# Patient Record
Sex: Male | Born: 1970
Health system: Southern US, Community
[De-identification: ages and names within clinical notes are randomized; demographics above are authoritative.]

## PROBLEM LIST (undated history)

## (undated) DIAGNOSIS — E7800 Pure hypercholesterolemia, unspecified: Secondary | ICD-10-CM

## (undated) HISTORY — PX: HERNIA REPAIR: SHX51

## (undated) HISTORY — PX: SKIN SURGERY: SHX2413

---

## 2000-04-01 ENCOUNTER — Ambulatory Visit (HOSPITAL_COMMUNITY): Admission: RE | Admit: 2000-04-01 | Discharge: 2000-04-01 | Payer: Self-pay | Admitting: Surgery

## 2006-04-29 ENCOUNTER — Encounter (INDEPENDENT_AMBULATORY_CARE_PROVIDER_SITE_OTHER): Payer: Self-pay | Admitting: Specialist

## 2006-04-29 ENCOUNTER — Inpatient Hospital Stay (HOSPITAL_COMMUNITY): Admission: AD | Admit: 2006-04-29 | Discharge: 2006-05-01 | Payer: Self-pay | Admitting: Internal Medicine

## 2006-05-07 ENCOUNTER — Ambulatory Visit (HOSPITAL_COMMUNITY): Admission: RE | Admit: 2006-05-07 | Discharge: 2006-05-07 | Payer: Self-pay | Admitting: Family Medicine

## 2006-12-18 ENCOUNTER — Emergency Department (HOSPITAL_COMMUNITY): Admission: EM | Admit: 2006-12-18 | Discharge: 2006-12-18 | Payer: Self-pay | Admitting: Emergency Medicine

## 2008-02-04 ENCOUNTER — Emergency Department (HOSPITAL_COMMUNITY): Admission: EM | Admit: 2008-02-04 | Discharge: 2008-02-04 | Payer: Self-pay | Admitting: Emergency Medicine

## 2009-09-05 ENCOUNTER — Ambulatory Visit (HOSPITAL_COMMUNITY): Admission: RE | Admit: 2009-09-05 | Discharge: 2009-09-05 | Payer: Self-pay | Admitting: Family Medicine

## 2009-09-05 IMAGING — CR DG LUMBAR SPINE COMPLETE 4+V
5 series · 5 of 5 positions shown · non-contrast
Comparison: None

CLINICAL DATA: Low back pain and

LUMBAR SPINE - COMPLETE 4+ VIEW

[view not recorded (1 of 5)]
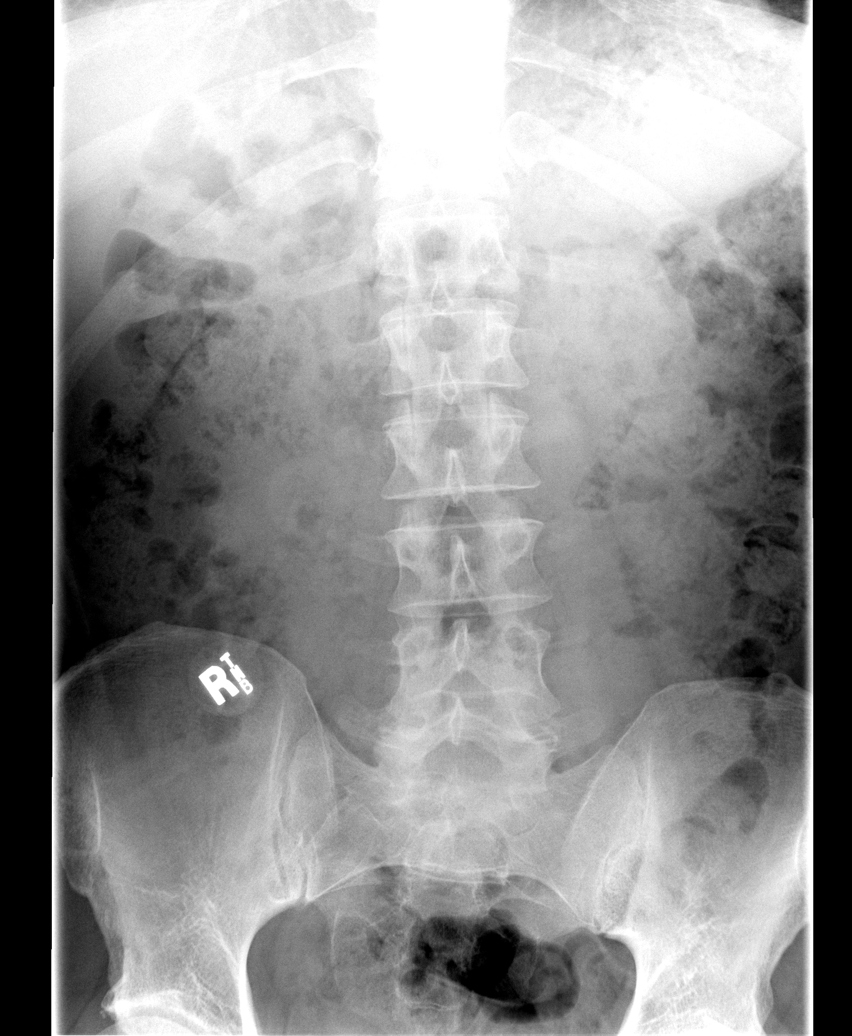

[view not recorded (2 of 5)]
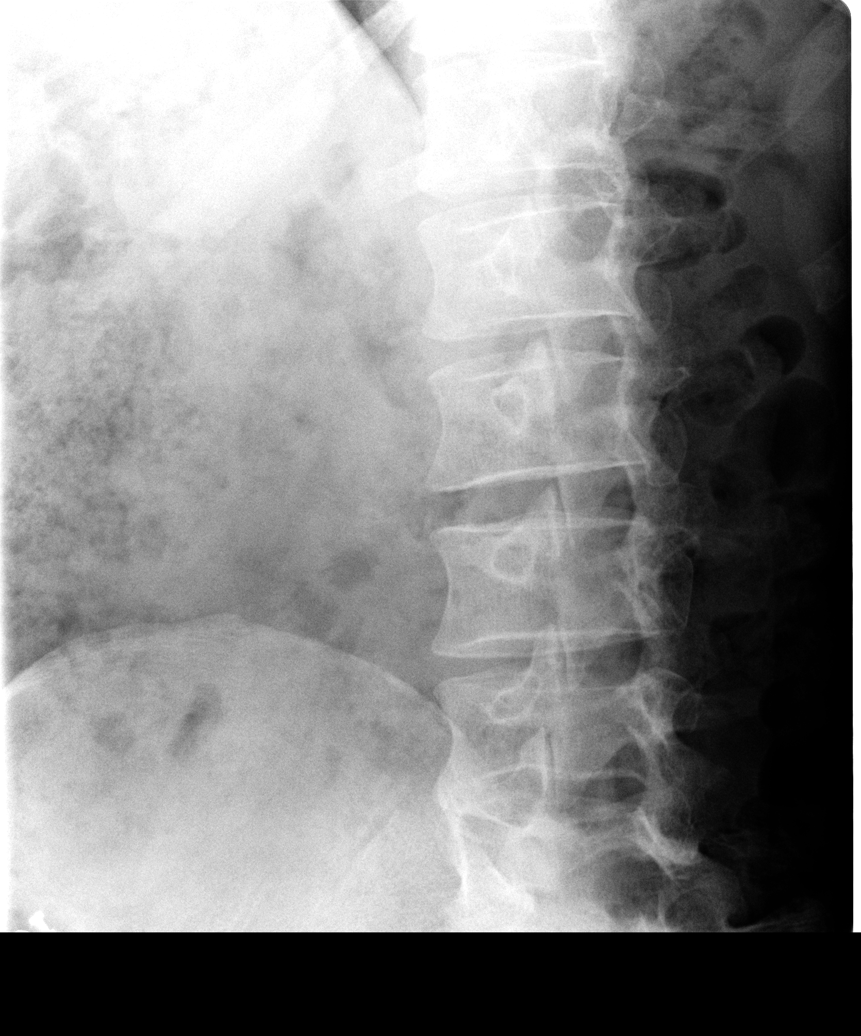

[view not recorded (3 of 5)]
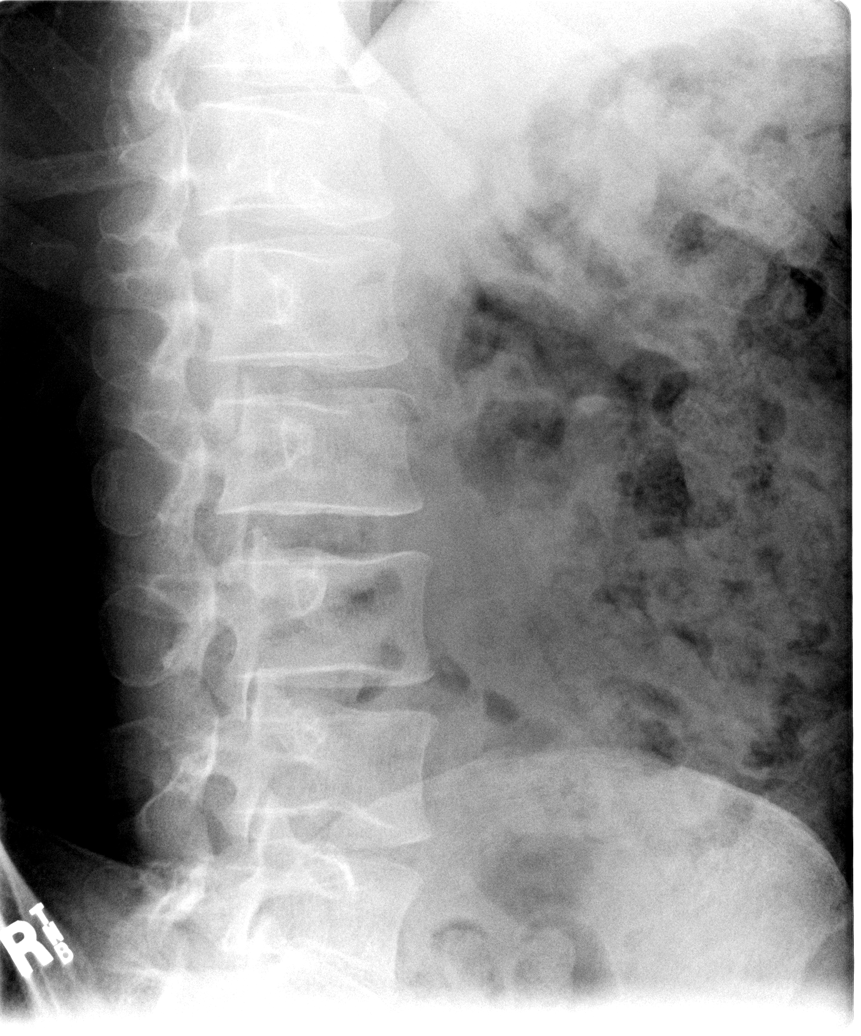

[view not recorded (4 of 5)]
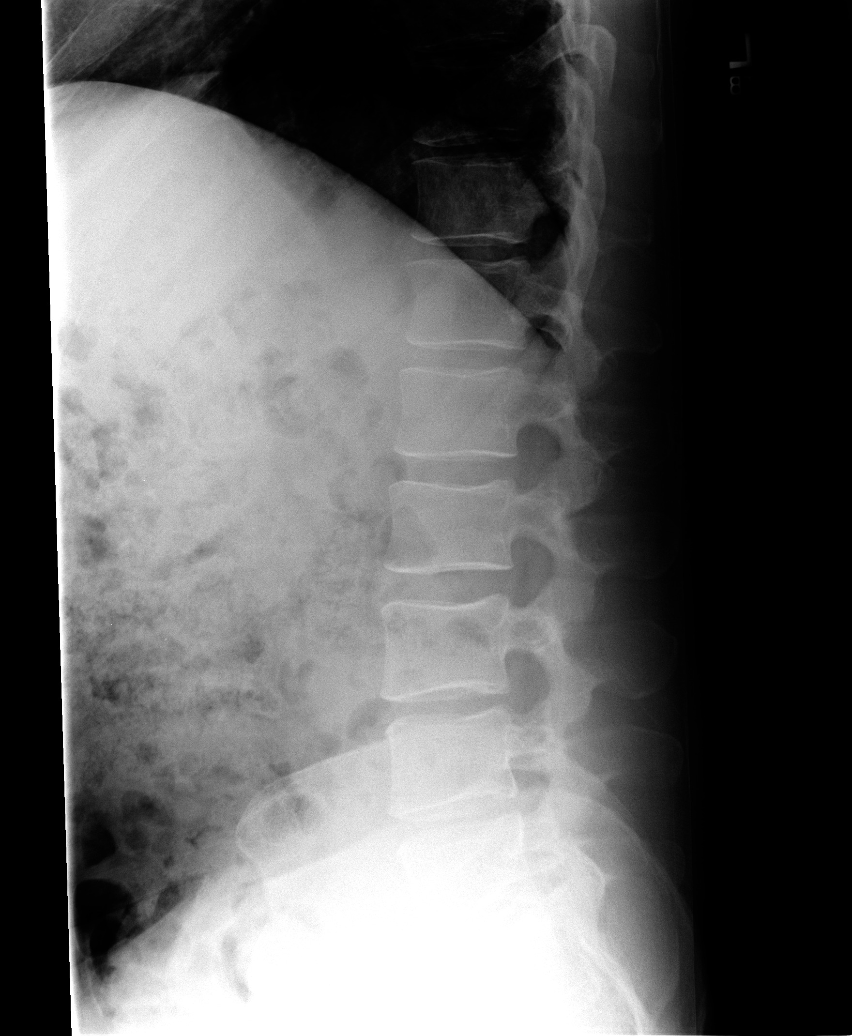

[view not recorded (5 of 5)]
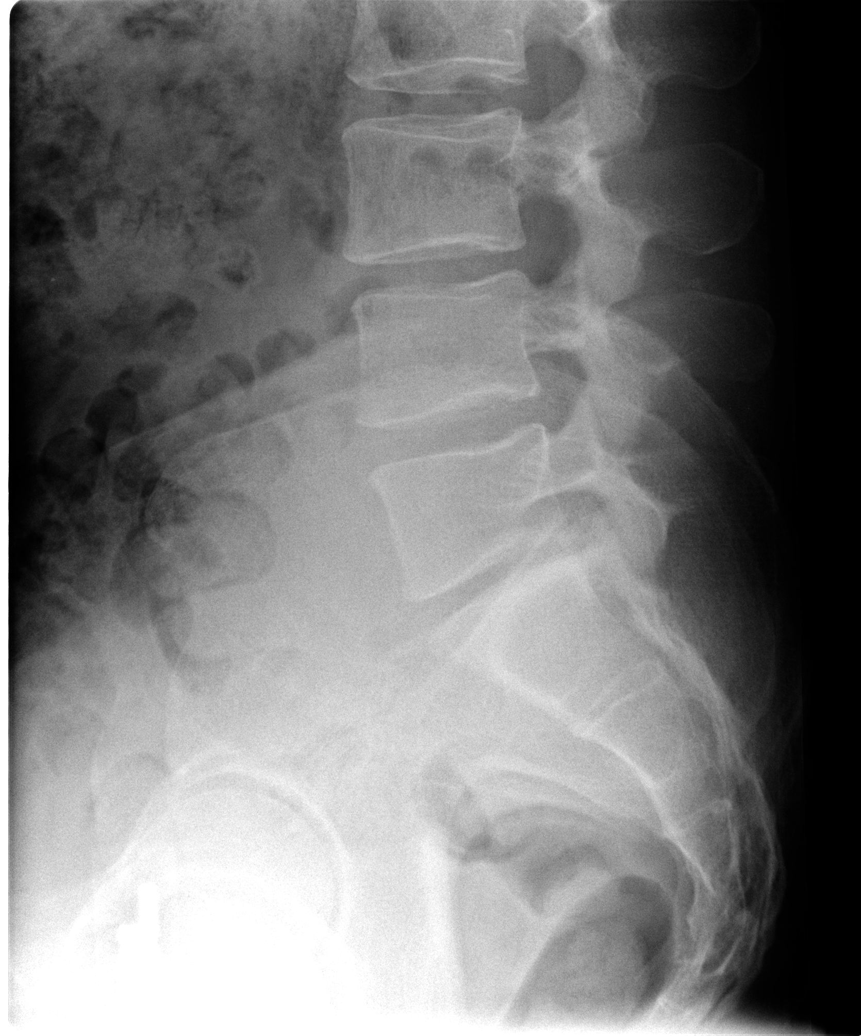

[5 of 5 positions shown; findings below may reference images not displayed]

FINDINGS: Five lumbar-type vertebra.  Normal alignment of the
vertebral bodies.  No evidence of subluxation.  No loss of
vertebral body height.  Oblique projections demonstrate no pars
defect.
IMPRESSION: No acute bony abnormality.

## 2011-03-06 NOTE — H&P (Signed)
NAME:  Benjamin Estrada, Benjamin Estrada                ACCOUNT NO.:  [id number]   MEDICAL RECORD NO.:  [id number]          PATIENT TYPE:  INP   LOCATION:  A313                          FACILITY:  [facility]   PHYSICIAN:  Madelin Rear. Sherwood Gambler, MD  DATE OF BIRTH:  December 07, 1970   DATE OF ADMISSION:  04/29/2006  DATE OF DISCHARGE:  LH                                HISTORY & PHYSICAL   CHIEF COMPLAINT:  Left arm pain.   HISTORY OF PRESENT ILLNESS:  The patient presented to the office and saw my  partner two days prior to this visit with a bump on his arm that was  draining.  It was painful and red.  It was cultured appropriately, with C&S  pending at present.  He was placed on presumed MRSA staph carbuncle  treatment with Bactrim and topical Bactroban.  His symptoms progressed, with  increased swelling, redness, and pain.   PAST MEDICAL HISTORY:  Status post herniorrhaphy; otherwise, unremarkable.   MEDICATIONS:  He is maintained on no chronic medications.   ALLERGIES:  HE IS ALLERGIC TO NO MEDICATIONS.   SOCIAL HISTORY:  Positive chewing tobacco.  Negative alcohol.   FAMILY HISTORY:  Positive for liver carcinoma in his father.  Mother is in  good health.  A lot of paternal and maternal grandparent carcinomas.   REVIEW OF SYSTEMS:  As above.  He denies any true rigors or other symptoms,  except for pain and swelling.   PHYSICAL EXAMINATION:  EXTREMITIES:  Marked cellulitic involvement of his  left forearm.  He has no lymphangitis or lymphadenitis.  He does have a semi-  fluctuant area in the center that is draining some purulent material.  It is  about 2 x 2 cm or 2.5 x 2.5 cm in overall diameter.  This is no crepitance.  He has marked forearm swelling, but his distal neurovascular exam is intact.   IMPRESSION:  Probable methicillin-resistant Staphylococcus aureus staph  sepsis, failure on oral antibiotics.   PLAN:  We are going to admit him for IV antibiotic therapy and surgical  consultation for possible  I&D.      Madelin Rear. Sherwood Gambler, MD  Electronically Signed    LJF/MEDQ  D:  04/29/2006  T:  04/29/2006  Job:  [id number]

## 2011-03-06 NOTE — Consult Note (Signed)
NAME:  Benjamin Estrada, Benjamin Estrada NO.:  [id number]   MEDICAL RECORD NO.:  [id number]          PATIENT TYPE:  INP   LOCATION:  A313                          FACILITY:  [facility]   PHYSICIAN:  Barbaraann Barthel, M.D. DATE OF BIRTH:  [birth year]   DATE OF CONSULTATION:  04/28/2006  DATE OF DISCHARGE:                                   CONSULTATION   NOTE:  Surgery was asked to see this 40 year old white male with a 5-day  history of discomfort in his left arm. He noticed a pimple on this area that  is a later enlarged, followed by cellulitis and swelling and then developed  fluctuans and was obviously getting worse.  He was admitted to the medical  service.  Antibiotics were initiated and surgery was consulted.  In essence,  I feel that he may have an abscess in this area and we will do an I&D and  obtain intraoperative cultures.  This can be done under local anesthesia.  This was explained to him.  The possibility also that this might be a spider  bite.  I am really not sure but clinically there seems to be some fluctuans  and we need to I&D this.  He is a non diabetic and his past medical history  is really unremarkable.  At present he is being  admitted with the treatment  with vancomycin and Cleocin.  He had been treated earlier with p.o. Bactrim  and topical Bactroban without  improvement.  We will obtain cultures as  stated to help delineate this.  At present will continue on this as this may  likely be an MRSA-staph type of folliculitis.      Barbaraann Barthel, M.D.  Electronically Signed     WB/MEDQ  D:  04/29/2006  T:  04/29/2006  Job:  [id number]   cc:   Madelin Rear. Sherwood Gambler, MD  Fax: [phone]

## 2011-03-06 NOTE — Op Note (Signed)
NAME:  EVERETTE, MALL NO.:  [id number]   MEDICAL RECORD NO.:  [id number]          PATIENT TYPE:  INP   LOCATION:  A313                          FACILITY:  [facility]   PHYSICIAN:  Barbaraann Barthel, M.D. DATE OF BIRTH:  08/12/1971   DATE OF PROCEDURE:  04/29/2006  DATE OF DISCHARGE:                                 OPERATIVE REPORT   SURGEON:  Barbaraann Barthel, M.D.   PREOPERATIVE DIAGNOSIS:  Abscess left forearm.   POSTOPERATIVE DIAGNOSIS:  Abscess left forearm.   PROCEDURE:  Incision and drainage of left forearm with debridement.   NOTE:  This is a 40 year old white male who had a 4-5 day history of  swelling in his right arm accompanied with erythema and increasing  discomfort.  He was admitted to the medical service.  There was a question  in my mind clinically whether this was an abscess or a spider bite, as the  patient gave a history of doing some mulching and developed some sort of  discomfort in his arm after that. However, clinically, he had what I felt to  be fluctuance and I took him to the operating room in the minor room with  the suspicion of an abscess present.   OPERATIVE FINDINGS:  Pus, yellowish type of fluid.  This was sent for  cultures.  Some necrotic tissue which was debrided   SPECIMENS:  Debrided tissue with cultures.   TECHNIQUE:  The patient was placed in the supine position, after prepping  with Betadine solution, draped in the usual manner.  I used 1% Xylocaine  without epinephrine, approximately 12 mL were used to anesthetize this area.  An elliptical incision was carried out over this wound removing the most  necrotic centrally located portion and an obvious abscess issued forth.  This was all evacuated and the necrotic tissue was further debrided.  Bleeding was controlled with the cautery device and after irrigating with  normal saline solution, the wound was packed open with normal saline soaked  gauze.  Prior to closure, all  sponge, needle and instrument counts were  found to be correct.  Estimated blood loss was minimal.  The patient  tolerated the procedure well and was taken directly to the floor.  There  were no complications.   The patient will continue his antibiotics as ordered by Dr. Sherwood Gambler.  There  will be change of service.  I will follow as needed and make follow-up  arrangements for his wound care when he is discharged.      Barbaraann Barthel, M.D.  Electronically Signed     WB/MEDQ  D:  04/29/2006  T:  04/29/2006  Job:  [id number]   cc:   Madelin Rear. Sherwood Gambler, MD  Fax: [phone]

## 2011-03-06 NOTE — Op Note (Signed)
[place]  Patient:    Benjamin Estrada, Benjamin Estrada                       MRN: [id number] Proc. Date: 04/01/00 Adm. Date:  [id number] Disc. Date: [id number] Attending:  Abigail Miyamoto A                           Operative Report  PREOPERATIVE DIAGNOSIS:  Right inguinal hernia.  POSTOPERATIVE DIAGNOSIS:  Right inguinal hernia.  OPERATION:  Right inguinal hernia repair.  SURGEON:  Abigail Miyamoto, M.D.  ANESTHESIA:  General endotracheal anesthesia and 0.25% Marcaine plain.  ESTIMATED BLOOD LOSS:  Minimal.  PROCEDURE IN DETAIL:  The patient was brought to the operating room and identified as Benjamin Estrada.  He was placed supine on the operating table, and general anesthesia was induced.  His right groin was then prepped and draped in the usual sterile fashion.  Using a #10 blade, a small longitudinal incision was made in the patients right groin.  The incision was carried down through the Scarpa fascia with electrocautery.  The external oblique fascia was then identified and opened with the scalpel and furthered toward the external ring with Metzenbaum scissors.  The testicular cord and structures were easily identified and controlled circumferentially with a Penrose drain.  The large chronic indirect hernia sac was then identified and separated from the cord structures.  The sac was then opened, an a finger was inserted into the perineal cavity.  The sac was then dissected down to its base.  The base was then closed off with a 2-0 silk pursestring suture.  The redundant sac was then cut with the electrocautery.  Next, the preperitoneal space around the internal ring was dissected out with a Raytec.  Next, a piece of the Prolene hernia system mesh from [employer] was brought onto the field. The mesh was medium and size.  The mesh was then inserted into the preperitoneal space.  The underlying piece of mesh folded into the peritoneal space nicely.  The  external mesh then deployed well into the inguinal floor. The mesh was then slit on the lateral edge for incorporating the cord structures.  Next, the mesh was sewn in place with interrupted 2-0 silk suture, first to the pubic tubercle, then to the transversalis fascia and the shoving edge of the inguinal ligament, then laterally.  The mesh was then also closed around the internal ring with a suture as well.  The wound was then thoroughly irrigated with normal saline.  Then 2-0 Vicryl was used to close the external oblique fascia in running fashion.  Scarpas fascia was closed with interrupted 3-0 Vicryl sutures, and skin was closed with running 4-0 Monocryl.  Steri-Strips, gauze, and tape were then applied.  Prior to closing, the wound was anesthetized with 0.25% Marcaine.  The patient tolerated the procedure well.  All sponge, needle, and instrument counts were correct at the end of the procedure.  The patient was then extubated in the operating room and taken in stable condition to the recovery room. DD:  04/01/00 TD:  04/05/00 Job: [id number]

## 2011-03-06 NOTE — Discharge Summary (Signed)
NAME:  MAXDEN, NAJI                ACCOUNT NO.:  [id number]   MEDICAL RECORD NO.:  [id number]          PATIENT TYPE:  INP   LOCATION:  A313                          FACILITY:  [facility]   PHYSICIAN:  Madelin Rear. Sherwood Gambler, MD  DATE OF BIRTH:  July 30, 1971   DATE OF ADMISSION:  04/29/2006  DATE OF DISCHARGE:  07/14/2007LH                                 DISCHARGE SUMMARY   DISCHARGE MEDICATIONS:  Cleocin 300 mg p.o. four times a day.   DISCHARGE DIAGNOSES:  Abscess and cellulitis, left forearm, secondary to  Staphylococcus aureus, presumed methicillin-resistant Staphylococcus aureus.   SUMMARY:  The patient was admitted with left-arm pain and mass with  fluctuance in his left forearm with extending cellulitis.  He had failed to  respond to oral antibiotics.  He was admitted and placed on presumptive  treatment for MRSA.  Seen in consultation with subsequent debridement,  drainage and cultures obtain with Dr. Malvin Johns.  On the day of discharge, he  was stable.  He will follow up in our office.      Madelin Rear. Sherwood Gambler, MD  Electronically Signed     LJF/MEDQ  D:  05/27/2006  T:  05/27/2006  Job:  [id number]

## 2016-02-21 DIAGNOSIS — F17220 Nicotine dependence, chewing tobacco, uncomplicated: Secondary | ICD-10-CM | POA: Diagnosis not present

## 2016-02-21 DIAGNOSIS — N529 Male erectile dysfunction, unspecified: Secondary | ICD-10-CM | POA: Diagnosis not present

## 2016-02-21 DIAGNOSIS — Z716 Tobacco abuse counseling: Secondary | ICD-10-CM | POA: Diagnosis not present

## 2016-02-21 DIAGNOSIS — E782 Mixed hyperlipidemia: Secondary | ICD-10-CM | POA: Diagnosis not present

## 2016-09-08 DIAGNOSIS — E782 Mixed hyperlipidemia: Secondary | ICD-10-CM | POA: Diagnosis not present

## 2016-09-09 DIAGNOSIS — N529 Male erectile dysfunction, unspecified: Secondary | ICD-10-CM | POA: Diagnosis not present

## 2016-09-09 DIAGNOSIS — Z720 Tobacco use: Secondary | ICD-10-CM | POA: Diagnosis not present

## 2016-09-09 DIAGNOSIS — Z6828 Body mass index (BMI) 28.0-28.9, adult: Secondary | ICD-10-CM | POA: Diagnosis not present

## 2016-09-09 DIAGNOSIS — E782 Mixed hyperlipidemia: Secondary | ICD-10-CM | POA: Diagnosis not present

## 2017-02-22 DIAGNOSIS — E782 Mixed hyperlipidemia: Secondary | ICD-10-CM | POA: Diagnosis not present

## 2017-02-24 DIAGNOSIS — Z720 Tobacco use: Secondary | ICD-10-CM | POA: Diagnosis not present

## 2017-02-24 DIAGNOSIS — N529 Male erectile dysfunction, unspecified: Secondary | ICD-10-CM | POA: Diagnosis not present

## 2017-02-24 DIAGNOSIS — E782 Mixed hyperlipidemia: Secondary | ICD-10-CM | POA: Diagnosis not present

## 2017-02-24 DIAGNOSIS — Z0000 Encounter for general adult medical examination without abnormal findings: Secondary | ICD-10-CM | POA: Diagnosis not present

## 2017-03-17 DIAGNOSIS — M545 Low back pain: Secondary | ICD-10-CM | POA: Diagnosis not present

## 2017-03-17 DIAGNOSIS — Z6828 Body mass index (BMI) 28.0-28.9, adult: Secondary | ICD-10-CM | POA: Diagnosis not present

## 2017-04-14 DIAGNOSIS — Z6836 Body mass index (BMI) 36.0-36.9, adult: Secondary | ICD-10-CM | POA: Diagnosis not present

## 2017-04-14 DIAGNOSIS — G8929 Other chronic pain: Secondary | ICD-10-CM | POA: Diagnosis not present

## 2017-04-14 DIAGNOSIS — M545 Low back pain: Secondary | ICD-10-CM | POA: Diagnosis not present

## 2018-02-28 DIAGNOSIS — Z0000 Encounter for general adult medical examination without abnormal findings: Secondary | ICD-10-CM | POA: Diagnosis not present

## 2018-02-28 DIAGNOSIS — N529 Male erectile dysfunction, unspecified: Secondary | ICD-10-CM | POA: Diagnosis not present

## 2018-02-28 DIAGNOSIS — Z720 Tobacco use: Secondary | ICD-10-CM | POA: Diagnosis not present

## 2018-02-28 DIAGNOSIS — E782 Mixed hyperlipidemia: Secondary | ICD-10-CM | POA: Diagnosis not present

## 2018-03-31 DIAGNOSIS — M62830 Muscle spasm of back: Secondary | ICD-10-CM | POA: Diagnosis not present

## 2018-03-31 DIAGNOSIS — M545 Low back pain: Secondary | ICD-10-CM | POA: Diagnosis not present

## 2018-07-04 DIAGNOSIS — E782 Mixed hyperlipidemia: Secondary | ICD-10-CM | POA: Diagnosis not present

## 2018-07-06 DIAGNOSIS — Z6828 Body mass index (BMI) 28.0-28.9, adult: Secondary | ICD-10-CM | POA: Diagnosis not present

## 2018-07-06 DIAGNOSIS — F5221 Male erectile disorder: Secondary | ICD-10-CM | POA: Diagnosis not present

## 2018-07-06 DIAGNOSIS — E782 Mixed hyperlipidemia: Secondary | ICD-10-CM | POA: Diagnosis not present

## 2018-07-06 DIAGNOSIS — Z720 Tobacco use: Secondary | ICD-10-CM | POA: Diagnosis not present

## 2021-09-16 ENCOUNTER — Emergency Department (HOSPITAL_COMMUNITY): Payer: BC Managed Care – PPO

## 2021-09-16 ENCOUNTER — Encounter (HOSPITAL_COMMUNITY): Payer: Self-pay | Admitting: *Deleted

## 2021-09-16 ENCOUNTER — Inpatient Hospital Stay (HOSPITAL_COMMUNITY)
Admission: EM | Disposition: A | Discharge status: Home / Self Care | Payer: Self-pay | Source: Home / Self Care | Attending: Cardiology

## 2021-09-16 ENCOUNTER — Inpatient Hospital Stay (HOSPITAL_COMMUNITY)
Admission: EM | Admit: 2021-09-16 | Discharge: 2021-09-17 | DRG: 282 | Disposition: A | Discharge status: Home / Self Care | Payer: BC Managed Care – PPO | Attending: Interventional Cardiology | Admitting: Interventional Cardiology

## 2021-09-16 ENCOUNTER — Other Ambulatory Visit: Payer: Self-pay

## 2021-09-16 DIAGNOSIS — R0602 Shortness of breath: Secondary | ICD-10-CM | POA: Diagnosis not present

## 2021-09-16 DIAGNOSIS — I2510 Atherosclerotic heart disease of native coronary artery without angina pectoris: Secondary | ICD-10-CM | POA: Diagnosis not present

## 2021-09-16 DIAGNOSIS — Z716 Tobacco abuse counseling: Secondary | ICD-10-CM

## 2021-09-16 DIAGNOSIS — I10 Essential (primary) hypertension: Secondary | ICD-10-CM

## 2021-09-16 DIAGNOSIS — I214 Non-ST elevation (NSTEMI) myocardial infarction: Secondary | ICD-10-CM | POA: Diagnosis not present

## 2021-09-16 DIAGNOSIS — R001 Bradycardia, unspecified: Secondary | ICD-10-CM | POA: Diagnosis not present

## 2021-09-16 DIAGNOSIS — Z720 Tobacco use: Secondary | ICD-10-CM

## 2021-09-16 DIAGNOSIS — Z20822 Contact with and (suspected) exposure to covid-19: Secondary | ICD-10-CM | POA: Diagnosis not present

## 2021-09-16 DIAGNOSIS — F17220 Nicotine dependence, chewing tobacco, uncomplicated: Secondary | ICD-10-CM | POA: Diagnosis present

## 2021-09-16 DIAGNOSIS — E785 Hyperlipidemia, unspecified: Secondary | ICD-10-CM | POA: Diagnosis not present

## 2021-09-16 DIAGNOSIS — I25110 Atherosclerotic heart disease of native coronary artery with unstable angina pectoris: Secondary | ICD-10-CM | POA: Diagnosis present

## 2021-09-16 DIAGNOSIS — R079 Chest pain, unspecified: Secondary | ICD-10-CM | POA: Diagnosis not present

## 2021-09-16 DIAGNOSIS — E782 Mixed hyperlipidemia: Secondary | ICD-10-CM | POA: Diagnosis not present

## 2021-09-16 HISTORY — DX: Pure hypercholesterolemia, unspecified: E78.00

## 2021-09-16 HISTORY — PX: LEFT HEART CATH AND CORONARY ANGIOGRAPHY: CATH118249

## 2021-09-16 LAB — CBC WITH DIFFERENTIAL/PLATELET
Abs Immature Granulocytes: 0.02 10*3/uL (ref 0.00–0.07)
Basophils Absolute: 0 10*3/uL (ref 0.0–0.1)
Basophils Relative: 1 %
Eosinophils Absolute: 0.1 10*3/uL (ref 0.0–0.5)
Eosinophils Relative: 1 %
HCT: 44.2 % (ref 39.0–52.0)
Hemoglobin: 15 g/dL (ref 13.0–17.0)
Immature Granulocytes: 0 %
Lymphocytes Relative: 32 %
Lymphs Abs: 1.9 10*3/uL (ref 0.7–4.0)
MCH: 30.1 pg (ref 26.0–34.0)
MCHC: 33.9 g/dL (ref 30.0–36.0)
MCV: 88.8 fL (ref 80.0–100.0)
Monocytes Absolute: 0.5 10*3/uL (ref 0.1–1.0)
Monocytes Relative: 8 %
Neutro Abs: 3.4 10*3/uL (ref 1.7–7.7)
Neutrophils Relative %: 58 %
Platelets: 210 10*3/uL (ref 150–400)
RBC: 4.98 MIL/uL (ref 4.22–5.81)
RDW: 12.5 % (ref 11.5–15.5)
WBC: 5.8 10*3/uL (ref 4.0–10.5)
nRBC: 0 % (ref 0.0–0.2)

## 2021-09-16 LAB — CREATININE, SERUM
Creatinine, Ser: 0.84 mg/dL (ref 0.61–1.24)
GFR, Estimated: >60 mL/min (ref >60)

## 2021-09-16 LAB — COMPREHENSIVE METABOLIC PANEL
ALT: 31 U/L (ref 0–44)
AST: 27 U/L (ref 15–41)
Albumin: 4.3 g/dL (ref 3.5–5.0)
Alkaline Phosphatase: 39 U/L (ref 38–126)
Anion gap: 7 (ref 5–15)
BUN: 11 mg/dL (ref 6–20)
CO2: 26 mmol/L (ref 22–32)
Calcium: 9.2 mg/dL (ref 8.9–10.3)
Chloride: 104 mmol/L (ref 98–111)
Creatinine, Ser: 0.88 mg/dL (ref 0.61–1.24)
GFR, Estimated: >60 mL/min (ref >60)
Glucose, Bld: 99 mg/dL (ref 70–99)
Potassium: 3.9 mmol/L (ref 3.5–5.1)
Sodium: 137 mmol/L (ref 135–145)
Total Bilirubin: 0.8 mg/dL (ref 0.3–1.2)
Total Protein: 6.8 g/dL (ref 6.5–8.1)

## 2021-09-16 LAB — TROPONIN I (HIGH SENSITIVITY): Troponin I (High Sensitivity): 1050 ng/L (ref <18)

## 2021-09-16 LAB — HIV ANTIBODY (ROUTINE TESTING W REFLEX): HIV Screen 4th Generation wRfx: NONREACTIVE

## 2021-09-16 LAB — RESP PANEL BY RT-PCR (FLU A&B, COVID) ARPGX2
Influenza A by PCR: NEGATIVE
Influenza B by PCR: NEGATIVE
SARS Coronavirus 2 by RT PCR: NEGATIVE

## 2021-09-16 SURGERY — LEFT HEART CATH AND CORONARY ANGIOGRAPHY
Anesthesia: LOCAL

## 2021-09-16 MED ORDER — ONDANSETRON HCL 4 MG/2ML IJ SOLN: 4.0000 mg | Freq: Four times a day (QID) | INTRAMUSCULAR | Status: DC | PRN

## 2021-09-16 MED ORDER — HEPARIN (PORCINE) 25000 UT/250ML-% IV SOLN
1200.0000 [IU]/h | INTRAVENOUS | Status: DC
  Administered 2021-09-16: 1200 [IU]/h via INTRAVENOUS

## 2021-09-16 MED ORDER — ACETAMINOPHEN 325 MG PO TABS: 650.0000 mg | ORAL_TABLET | ORAL | Status: DC | PRN

## 2021-09-16 MED ORDER — SODIUM CHLORIDE 0.9% FLUSH: 3.0000 mL | INTRAVENOUS | Status: DC | PRN

## 2021-09-16 MED ORDER — NITROGLYCERIN 0.4 MG SL SUBL: 0.4000 mg | SUBLINGUAL_TABLET | SUBLINGUAL | Status: DC | PRN

## 2021-09-16 MED ORDER — FENTANYL CITRATE (PF) 100 MCG/2ML IJ SOLN
INTRAMUSCULAR | Status: DC | PRN
  Administered 2021-09-16: 50 ug via INTRAVENOUS

## 2021-09-16 MED ORDER — SODIUM CHLORIDE 0.9 % IV SOLN: 250.0000 mL | INTRAVENOUS | Status: DC | PRN

## 2021-09-16 MED ORDER — IOHEXOL 350 MG/ML SOLN
INTRAVENOUS | Status: DC | PRN
  Administered 2021-09-16: 55 mL

## 2021-09-16 MED ORDER — HEPARIN (PORCINE) IN NACL 1000-0.9 UT/500ML-% IV SOLN
INTRAVENOUS | Status: DC | PRN
  Administered 2021-09-16 (×3): 500 mL

## 2021-09-16 MED ORDER — SODIUM CHLORIDE 0.9 % WEIGHT BASED INFUSION: 1.0000 mL/kg/h | INTRAVENOUS | Status: DC

## 2021-09-16 MED ORDER — ASPIRIN 325 MG PO TABS
325.0000 mg | ORAL_TABLET | Freq: Once | ORAL | Status: AC
  Administered 2021-09-16: 325 mg via ORAL

## 2021-09-16 MED ORDER — HEPARIN SODIUM (PORCINE) 5000 UNIT/ML IJ SOLN
5000.0000 [IU] | Freq: Three times a day (TID) | INTRAMUSCULAR | Status: DC
  Administered 2021-09-17: 5000 [IU] via SUBCUTANEOUS

## 2021-09-16 MED ORDER — VERAPAMIL HCL 2.5 MG/ML IV SOLN
INTRAVENOUS | Status: DC | PRN
  Administered 2021-09-16: 10 mL via INTRA_ARTERIAL

## 2021-09-16 MED ORDER — ASPIRIN EC 81 MG PO TBEC
81.0000 mg | DELAYED_RELEASE_TABLET | Freq: Every day | ORAL | Status: DC
  Administered 2021-09-17: 81 mg via ORAL
  Filled 2021-09-16: qty 1

## 2021-09-16 MED ORDER — HEPARIN BOLUS VIA INFUSION
4000.0000 [IU] | Freq: Once | INTRAVENOUS | Status: AC
  Administered 2021-09-16: 4000 [IU] via INTRAVENOUS

## 2021-09-16 MED ORDER — SODIUM CHLORIDE 0.9 % WEIGHT BASED INFUSION
1.0000 mL/kg/h | INTRAVENOUS | Status: AC
  Administered 2021-09-16: 1 mL/kg/h via INTRAVENOUS

## 2021-09-16 MED ORDER — ATORVASTATIN CALCIUM 80 MG PO TABS
80.0000 mg | ORAL_TABLET | Freq: Every day | ORAL | Status: DC
  Administered 2021-09-16: 80 mg via ORAL

## 2021-09-16 MED ORDER — MIDAZOLAM HCL 2 MG/2ML IJ SOLN
INTRAMUSCULAR | Status: DC | PRN
  Administered 2021-09-16: 2 mg via INTRAVENOUS

## 2021-09-16 MED ORDER — SODIUM CHLORIDE 0.9% FLUSH
3.0000 mL | Freq: Two times a day (BID) | INTRAVENOUS | Status: DC
  Administered 2021-09-17: 3 mL via INTRAVENOUS

## 2021-09-16 MED ORDER — SODIUM CHLORIDE 0.9 % WEIGHT BASED INFUSION: 3.0000 mL/kg/h | INTRAVENOUS | Status: AC

## 2021-09-16 MED ORDER — HEPARIN SODIUM (PORCINE) 1000 UNIT/ML IJ SOLN
INTRAMUSCULAR | Status: DC | PRN
  Administered 2021-09-16: 5000 [IU] via INTRAVENOUS

## 2021-09-16 SURGICAL SUPPLY — 9 items
CATH 5FR JL3.5 JR4 ANG PIG MP (CATHETERS) ×2 IMPLANT
DEVICE RAD COMP TR BAND LRG (VASCULAR PRODUCTS) ×2 IMPLANT
GLIDESHEATH SLEND SS 6F .021 (SHEATH) ×2 IMPLANT
GUIDEWIRE INQWIRE 1.5J.035X260 (WIRE) ×1 IMPLANT
INQWIRE 1.5J .035X260CM (WIRE) ×2
KIT HEART LEFT (KITS) ×2 IMPLANT
PACK CARDIAC CATHETERIZATION (CUSTOM PROCEDURE TRAY) ×2 IMPLANT
TRANSDUCER W/STOPCOCK (MISCELLANEOUS) ×2 IMPLANT
TUBING CIL FLEX 10 FLL-RA (TUBING) ×2 IMPLANT

## 2021-09-16 NOTE — ED Notes (Signed)
Report called to Cath lab and [facility]

## 2021-09-16 NOTE — Progress Notes (Signed)
ANTICOAGULATION CONSULT NOTE - Initial Consult  Pharmacy Consult for Heparin Indication: chest pain/ACS  No Known Allergies  Patient Measurements: Height: 6' (182.9 cm) Weight: 95.3 kg (210 lb) IBW/kg (Calculated) : 77.6 HEPARIN DW (KG): 95.3  Vital Signs: Temp: 97.6 F (36.4 C) (11/29 1010) Temp Source: Oral (11/29 1010) BP: 136/97 (11/29 1130) Pulse Rate: 63 (11/29 1130)  Labs: Recent Labs    09/16/21 1050  HGB 15.0  HCT 44.2  PLT 210  CREATININE 0.88  TROPONINIHS 1,050*    Estimated Creatinine Clearance: 117.6 mL/min (by C-G formula based on SCr of 0.88 mg/dL).   Medical History: Past Medical History:  Diagnosis Date   High cholesterol     Medications:  See med rec  Assessment: Pt presents to ED with  c/o intermittent left sided chest pain with difficulty breathing for weeks. Patients troponin is elevated. Not on oral anticoagulants prior to admission. Pharmacy asked to start heparin  Goal of Therapy:  Heparin level 0.3-0.7 units/ml Monitor platelets by anticoagulation protocol: Yes   Plan:  Give 4000 units bolus x 1 Start heparin infusion at 1200 units/hr Check anti-Xa level in 6 hours and daily while on heparin Continue to monitor H&H and platelets  Elder Cyphers, BS Loura Back, BCPS Clinical Pharmacist Pager [phone] 09/16/2021,12:23 PM

## 2021-09-16 NOTE — Interval H&P Note (Signed)
History and Physical Interval Note:  09/16/2021 4:06 PM  Benjamin Estrada  has presented today for surgery, with the diagnosis of nstemi.  The various methods of treatment have been discussed with the patient and family. After consideration of risks, benefits and other options for treatment, the patient has consented to  Procedure(s): LEFT HEART CATH AND CORONARY ANGIOGRAPHY (N/A) as a surgical intervention.  The patient's history has been reviewed, patient examined, no change in status, stable for surgery.  I have reviewed the patient's chart and labs.  Questions were answered to the patient's satisfaction.   Cath Lab Visit (complete for each Cath Lab visit)  Clinical Evaluation Leading to the Procedure:   ACS: Yes.    Non-ACS:    Anginal Classification: CCS IV  Anti-ischemic medical therapy: No Therapy  Non-Invasive Test Results: No non-invasive testing performed  Prior CABG: No previous CABG        Theron Arista [facility] 09/16/2021 4:06 PM

## 2021-09-16 NOTE — ED Triage Notes (Signed)
Pt c/o intermittent left sided chest pain with difficulty breathing x weeks. Pt went to see his PCP today and was told to come to the ED for further evaluation. Denies n/v, dizziness, lightheadedness, diaphoresis.

## 2021-09-16 NOTE — ED Provider Notes (Signed)
[facility] EMERGENCY DEPARTMENT Provider Note   CSN: [id number] Arrival date & time: 09/16/21  [id number]     History No chief complaint on file.   Benjamin Estrada is a 50 y.o. male.  Patient states he has been having episodes of chest discomfort left arm pain that has been lasting 15 to 45 minutes.  Last episode was yesterday   Chest Pain Pain location:  L chest Pain quality: aching   Pain radiates to:  L shoulder Pain severity:  Moderate Onset quality:  Sudden Timing:  Intermittent Progression:  Resolved Chronicity:  New Context: not breathing   Relieved by:  Nothing Associated symptoms: no abdominal pain, no back pain, no cough, no fatigue and no headache       Past Medical History:  Diagnosis Date   High cholesterol     Patient Active Problem List   Diagnosis Date Noted   NSTEMI (non-ST elevated myocardial infarction) (HCC) 09/16/2021    Past Surgical History:  Procedure Laterality Date   HERNIA REPAIR     SKIN SURGERY     MRSA taken off the left arm       No family history on file.  Social History   Tobacco Use   Smoking status: Never   Smokeless tobacco: Current    Types: Chew  Vaping Use   Vaping Use: Never used  Substance Use Topics   Alcohol use: Never   Drug use: Never    Home Medications Prior to Admission medications   Not on File    Allergies    Patient has no known allergies.  Review of Systems   Review of Systems  Constitutional:  Negative for appetite change and fatigue.  HENT:  Negative for congestion, ear discharge and sinus pressure.   Eyes:  Negative for discharge.  Respiratory:  Negative for cough.   Cardiovascular:  Positive for chest pain.  Gastrointestinal:  Negative for abdominal pain and diarrhea.  Genitourinary:  Negative for frequency and hematuria.  Musculoskeletal:  Negative for back pain.  Skin:  Negative for rash.  Neurological:  Negative for seizures and headaches.  Psychiatric/Behavioral:  Negative for  hallucinations.    Physical Exam Updated Vital Signs BP (!) 122/99   Pulse (!) 49   Temp 97.6 F (36.4 C) (Oral)   Resp 11   Ht 6' (1.829 m)   Wt 95.3 kg   SpO2 100%   BMI 28.48 kg/m   Physical Exam Vitals and nursing note reviewed.  Constitutional:      Appearance: He is well-developed.  HENT:     Head: Normocephalic.     Nose: Nose normal.  Eyes:     General: No scleral icterus.    Conjunctiva/sclera: Conjunctivae normal.  Neck:     Thyroid: No thyromegaly.  Cardiovascular:     Rate and Rhythm: Normal rate and regular rhythm.     Heart sounds: No murmur heard.   No friction rub. No gallop.  Pulmonary:     Breath sounds: No stridor. No wheezing or rales.  Chest:     Chest wall: No tenderness.  Abdominal:     General: There is no distension.     Tenderness: There is no abdominal tenderness. There is no rebound.  Musculoskeletal:        General: Normal range of motion.     Cervical back: Neck supple.  Lymphadenopathy:     Cervical: No cervical adenopathy.  Skin:    Findings: No erythema or rash.  Neurological:     Mental Status: He is alert and oriented to person, place, and time.     Motor: No abnormal muscle tone.     Coordination: Coordination normal.  Psychiatric:        Behavior: Behavior normal.    ED Results / Procedures / Treatments   Labs (all labs ordered are listed, but only abnormal results are displayed) Labs Reviewed  TROPONIN I (HIGH SENSITIVITY) - Abnormal; Notable for the following components:      Result Value   Troponin I (High Sensitivity) 1,050 (*)    All other components within normal limits  TROPONIN I (HIGH SENSITIVITY) - Abnormal; Notable for the following components:   Troponin I (High Sensitivity) 1,126 (*)    All other components within normal limits  RESP PANEL BY RT-PCR (FLU A&B, COVID) ARPGX2  CBC WITH DIFFERENTIAL/PLATELET  COMPREHENSIVE METABOLIC PANEL  HEPARIN LEVEL (UNFRACTIONATED)    EKG None  Radiology DG  Chest Port 1 View  Result Date: 09/16/2021 CLINICAL DATA:  Left chest pain, shortness of breath EXAM: PORTABLE CHEST 1 VIEW COMPARISON:  05/07/2006 FINDINGS: The heart size and mediastinal contours are within normal limits. Both lungs are clear. The visualized skeletal structures are unremarkable. IMPRESSION: No active disease. Electronically Signed   By: Judie Petit.  Shick M.D.   On: 09/16/2021 11:06    Procedures Procedures   Medications Ordered in ED Medications  0.9% sodium chloride infusion (has no administration in time range)    Followed by  0.9% sodium chloride infusion (has no administration in time range)  heparin bolus via infusion 4,000 Units (4,000 Units Intravenous Bolus from Bag 09/16/21 1253)    Followed by  heparin ADULT infusion 100 units/mL (25000 units/236mL) (1,200 Units/hr Intravenous New Bag/Given 09/16/21 1256)  aspirin tablet 325 mg (325 mg Oral Given 09/16/21 1148)    ED Course  I have reviewed the triage vital signs and the nursing notes.  Pertinent labs & imaging results that were available during my care of the patient were reviewed by me and considered in my medical decision making (see chart for details). CRITICAL CARE Performed by: Bethann Berkshire Total critical care time: 45 minutes Critical care time was exclusive of separately billable procedures and treating other patients. Critical care was necessary to treat or prevent imminent or life-threatening deterioration. Critical care was time spent personally by me on the following activities: development of treatment plan with patient and/or surrogate as well as nursing, discussions with consultants, evaluation of patient's response to treatment, examination of patient, obtaining history from patient or surrogate, ordering and performing treatments and interventions, ordering and review of laboratory studies, ordering and review of radiographic studies, pulse oximetry and re-evaluation of patient's condition.    MDM  Rules/Calculators/A&P                           Patient with an NSTEMI.  He will be admitted to [facility] by cardiology Final Clinical Impression(s) / ED Diagnoses Final diagnoses:  None    Rx / DC Orders ED Discharge Orders     None        Bethann Berkshire, MD 09/16/21 1329

## 2021-09-16 NOTE — H&P (Addendum)
Cardiology Admission History and Physical:   Patient ID: Benjamin Estrada MRN: [id number]; DOB: [birth year]   Admission date: 09/16/2021  PCP:  Benjamin Stabile, MD   [facility] Providers Cardiologist:  None   {    Chief Complaint:  Chest pain  Patient Profile:   Benjamin Estrada is a 50 y.o. male with history of tobacco abuse who is being seen 09/16/2021 for the evaluation of NSTEMI.  History of Present Illness:   Benjamin Estrada is a 50 year old male with history of tobacco abuse who is not currently on any medications who presented to the ER with 2 weeks of worsening chest pain radiating down the arms and into the jaw found to have troponin of 1000 consistent with NSTEMI.  Patient states that about 2 weeks ago he began developing chest pressure radiating down his arms and into his jaw that would last anywhere from 10-29min. Initially the symptoms occurred about every other day but have steadily increased in frequency. Symptoms could occur both with rest and with exertion. This Friday, he was [place] when he developed more severe SOB, chest pain and arm pain prompting him to go see his PCP today. After hearing his symptoms, his PCP referred him to the ER for further evaluation.  In the ER, the patient is HD stable and currently chest pain free.Initial trop 1000. ECG with NSR without acute ischemic changes.    Past Medical History:  Diagnosis Date   High cholesterol     Past Surgical History:  Procedure Laterality Date   HERNIA REPAIR     SKIN SURGERY     MRSA taken off the left arm     Medications Prior to Admission: Prior to Admission medications   Not on File     Allergies:   No Known Allergies  Social History:   Social History   Socioeconomic History   Marital status: Married    Spouse name: Not on file   Number of children: Not on file   Years of education: Not on file   Highest education level: Not on file  Occupational History   Not on  file  Tobacco Use   Smoking status: Never   Smokeless tobacco: Current    Types: Chew  Vaping Use   Vaping Use: Never used  Substance and Sexual Activity   Alcohol use: Never   Drug use: Never   Sexual activity: Not on file  Other Topics Concern   Not on file  Social History Narrative   Not on file   Social Determinants of Health   Financial Resource Strain: Not on file  Food Insecurity: Not on file  Transportation Needs: Not on file  Physical Activity: Not on file  Stress: Not on file  Social Connections: Not on file  Intimate Partner Violence: Not on file    Family History:   The patient's family history is not on file.    ROS:  Please see the history of present illness.  Review of Systems  Constitutional:  Negative for chills and fever.  Respiratory:  Positive for shortness of breath.   Cardiovascular:  Positive for chest pain. Negative for palpitations, orthopnea, claudication, leg swelling and PND.  Musculoskeletal:  Negative for falls.  Neurological:  Negative for dizziness and loss of consciousness.      Physical Exam/Data:   Vitals:   09/16/21 1010 09/16/21 1045 09/16/21 1130 09/16/21 1200  BP: (!) 137/94 (!) 137/96 (!) 136/97 Marland Kitchen)  122/99  Pulse: 65 74 63 (!) 49  Resp: 18 15 20 11   Temp: 97.6 F (36.4 C)     TempSrc: Oral     SpO2: 99% 99% 98% 100%  Weight:      Height:       No intake or output data in the 24 hours ending 09/16/21 1235 Last 3 Weights 09/16/2021  Weight (lbs) 210 lb  Weight (kg) 95.255 kg     Body mass index is 28.48 kg/m.  General:  Well nourished, well developed, in no acute distress HEENT: normal Neck: no JVD Vascular: No carotid bruits; Distal pulses 2+ bilaterally   Cardiac:  normal S1, S2; RRR; no murmur  Lungs:  clear to auscultation bilaterally, no wheezing, rhonchi or rales  Abd: soft, nontender, no hepatomegaly  Ext: no edema Musculoskeletal:  No deformities, BUE and BLE strength normal and equal Skin: warm and dry   Neuro:  CNs 2-12 intact, no focal abnormalities noted Psych:  Normal affect    EKG:  The ECG that was done  was personally reviewed and demonstrates NSR with no acute ischemic changes  Relevant CV Studies: No CV studies performed  Laboratory Data:  High Sensitivity Troponin:   Recent Labs  Lab 09/16/21 1050  TROPONINIHS 1,050*      Chemistry Recent Labs  Lab 09/16/21 1050  NA 137  K 3.9  CL 104  CO2 26  GLUCOSE 99  BUN 11  CREATININE 0.88  CALCIUM 9.2  GFRNONAA >60  ANIONGAP 7    Recent Labs  Lab 09/16/21 1050  PROT 6.8  ALBUMIN 4.3  AST 27  ALT 31  ALKPHOS 39  BILITOT 0.8   Lipids No results for input(s): CHOL, TRIG, HDL, LABVLDL, LDLCALC, CHOLHDL in the last 168 hours. Hematology Recent Labs  Lab 09/16/21 1050  WBC 5.8  RBC 4.98  HGB 15.0  HCT 44.2  MCV 88.8  MCH 30.1  MCHC 33.9  RDW 12.5  PLT 210   Thyroid No results for input(s): TSH, FREET4 in the last 168 hours. BNPNo results for input(s): BNP, PROBNP in the last 168 hours.  DDimer No results for input(s): DDIMER in the last 168 hours.   Radiology/Studies:  DG Chest Port 1 View  Result Date: 09/16/2021 CLINICAL DATA:  Left chest pain, shortness of breath EXAM: PORTABLE CHEST 1 VIEW COMPARISON:  05/07/2006 FINDINGS: The heart size and mediastinal contours are within normal limits. Both lungs are clear. The visualized skeletal structures are unremarkable. IMPRESSION: No active disease. Electronically Signed   By: 05/09/2006.  Shick M.D.   On: 09/16/2021 11:06     Assessment and Plan:   #NSTEMI: Patient presents with classic anginal symptoms that have progressively worsened over the past 2 weeks found to have troponin of 1000 consistent with NSTEMI. No STD/STE on ECG. Not currently on medications and no prior CV history. Current tobacco user but no known history of HTN, HLD, HF. -Transfer to [facility] for cath -Start heparin gtt -S/p ASA 325mg  in ED, start ASA 81mg  daily thereafter -Start lipitor 80mg   daily -No BB due to bradycardia -Will add ARB post-cath -Check TTE -Tobacco cessation counseling -Check Lipid panel and A1C   #Tobacco Use: -Encourage cessation  INFORMED CONSENT: I have reviewed the risks, indications, and alternatives to cardiac catheterization, possible angioplasty, and stenting with the patient. Risks include but are not limited to bleeding, infection, vascular injury, stroke, myocardial infection, arrhythmia, kidney injury, radiation-related injury in the case of prolonged fluoroscopy use, emergency  cardiac surgery, and death. The patient understands the risks of serious complication is 1-2 in 1000 with diagnostic cardiac cath and 1-2% or less with angioplasty/stenting.     Risk Assessment/Risk Scores:    TIMI Risk Score for Unstable Angina or Non-ST Elevation MI:   The patient's TIMI risk score is 3, which indicates a 13% risk of all cause mortality, new or recurrent myocardial infarction or need for urgent revascularization in the next 14 days.{    Severity of Illness: The appropriate patient status for this patient is INPATIENT. Inpatient status is judged to be reasonable and necessary in order to provide the required intensity of service to ensure the patient's safety. The patient's presenting symptoms, physical exam findings, and initial radiographic and laboratory data in the context of their chronic comorbidities is felt to place them at high risk for further clinical deterioration. Furthermore, it is not anticipated that the patient will be medically stable for discharge from the hospital within 2 midnights of admission.   * I certify that at the point of admission it is my clinical judgment that the patient will require inpatient hospital care spanning beyond 2 midnights from the point of admission due to high intensity of service, high risk for further deterioration and high frequency of surveillance required.*   For questions or updates, please contact [facility] Please consult [email or web address] for contact info under     Signed, Meriam Sprague, MD  09/16/2021 12:35 PM

## 2021-09-17 ENCOUNTER — Other Ambulatory Visit (HOSPITAL_COMMUNITY): Payer: Self-pay

## 2021-09-17 ENCOUNTER — Encounter (HOSPITAL_COMMUNITY): Payer: Self-pay | Admitting: Cardiology

## 2021-09-17 ENCOUNTER — Telehealth (HOSPITAL_COMMUNITY): Payer: Self-pay | Admitting: Pharmacist

## 2021-09-17 DIAGNOSIS — E785 Hyperlipidemia, unspecified: Secondary | ICD-10-CM

## 2021-09-17 DIAGNOSIS — I10 Essential (primary) hypertension: Secondary | ICD-10-CM

## 2021-09-17 DIAGNOSIS — Z720 Tobacco use: Secondary | ICD-10-CM

## 2021-09-17 DIAGNOSIS — E782 Mixed hyperlipidemia: Secondary | ICD-10-CM

## 2021-09-17 LAB — CBC
HCT: 40.2 % (ref 39.0–52.0)
Hemoglobin: 14.1 g/dL (ref 13.0–17.0)
MCH: 30.7 pg (ref 26.0–34.0)
MCHC: 35.1 g/dL (ref 30.0–36.0)
MCV: 87.6 fL (ref 80.0–100.0)
Platelets: 213 10*3/uL (ref 150–400)
RBC: 4.59 MIL/uL (ref 4.22–5.81)
RDW: 12.6 % (ref 11.5–15.5)
WBC: 6.6 10*3/uL (ref 4.0–10.5)
nRBC: 0 % (ref 0.0–0.2)

## 2021-09-17 LAB — BASIC METABOLIC PANEL
Anion gap: 9 (ref 5–15)
BUN: 11 mg/dL (ref 6–20)
CO2: 23 mmol/L (ref 22–32)
Calcium: 9.3 mg/dL (ref 8.9–10.3)
Chloride: 105 mmol/L (ref 98–111)
Creatinine, Ser: 1.01 mg/dL (ref 0.61–1.24)
GFR, Estimated: >60 mL/min (ref >60)
Glucose, Bld: 91 mg/dL (ref 70–99)
Potassium: 4.2 mmol/L (ref 3.5–5.1)
Sodium: 137 mmol/L (ref 135–145)

## 2021-09-17 LAB — LIPID PANEL
Cholesterol: 227 mg/dL — ABNORMAL HIGH (ref 0–200)
HDL: 35 mg/dL — ABNORMAL LOW (ref >40)
LDL Cholesterol: 151 mg/dL — ABNORMAL HIGH (ref 0–99)
Total CHOL/HDL Ratio: 6.5 RATIO
Triglycerides: 206 mg/dL — ABNORMAL HIGH (ref <150)
VLDL: 41 mg/dL — ABNORMAL HIGH (ref 0–40)

## 2021-09-17 LAB — HEMOGLOBIN A1C
Hgb A1c MFr Bld: 5.3 % (ref 4.8–5.6)
Mean Plasma Glucose: 105 mg/dL

## 2021-09-17 MED ORDER — ASPIRIN 81 MG PO TBEC
81.0000 mg | DELAYED_RELEASE_TABLET | Freq: Every day | ORAL | 2 refills | Status: DC
  Filled 2021-09-17: qty 90, 90d supply, fill #0

## 2021-09-17 MED ORDER — HEPARIN SODIUM (PORCINE) 5000 UNIT/ML IJ SOLN
INTRAMUSCULAR | Status: AC
  Filled 2021-09-17: qty 1

## 2021-09-17 MED ORDER — CLOPIDOGREL BISULFATE 75 MG PO TABS
75.0000 mg | ORAL_TABLET | Freq: Every day | ORAL | Status: DC
  Administered 2021-09-17: 75 mg via ORAL
  Filled 2021-09-17: qty 1

## 2021-09-17 MED ORDER — METOPROLOL TARTRATE 25 MG PO TABS
12.5000 mg | ORAL_TABLET | Freq: Two times a day (BID) | ORAL | 0 refills | Status: DC
  Filled 2021-09-17: qty 90, 90d supply, fill #0

## 2021-09-17 MED ORDER — CLOPIDOGREL BISULFATE 75 MG PO TABS
75.0000 mg | ORAL_TABLET | Freq: Every day | ORAL | 1 refills | Status: DC
  Filled 2021-09-17: qty 90, 90d supply, fill #0

## 2021-09-17 MED ORDER — NITROGLYCERIN 0.4 MG SL SUBL
0.4000 mg | SUBLINGUAL_TABLET | SUBLINGUAL | 3 refills | Status: DC | PRN
  Filled 2021-09-17: qty 25, 7d supply, fill #0

## 2021-09-17 MED ORDER — METOPROLOL TARTRATE 12.5 MG HALF TABLET
12.5000 mg | ORAL_TABLET | Freq: Two times a day (BID) | ORAL | Status: DC
  Administered 2021-09-17: 12.5 mg via ORAL
  Filled 2021-09-17: qty 1

## 2021-09-17 MED ORDER — ATORVASTATIN CALCIUM 80 MG PO TABS
80.0000 mg | ORAL_TABLET | Freq: Every day | ORAL | 2 refills | Status: DC
  Filled 2021-09-17: qty 90, 90d supply, fill #0

## 2021-09-17 MED FILL — Lidocaine HCl Local Preservative Free (PF) Inj 1%: INTRAMUSCULAR | Qty: 30 | Status: AC

## 2021-09-17 NOTE — Progress Notes (Signed)
Mobility Specialist Progress Note    09/17/21 1108  Mobility  Activity Ambulated in hall  Level of Assistance Independent  Assistive Device None  Distance Ambulated (ft) 480 ft  Mobility Ambulated independently in hallway  Mobility Response Tolerated well  Mobility performed by Mobility specialist  $Mobility charge 1 Mobility   During Mobility: 77 HR  Pt received sitting EOB and agreeable. No complaints on walk. Returned to room with wife present.   [facility] Mobility Specialist  M.S. Primary Phone: [phone] M.S. Secondary Phone: [phone]

## 2021-09-17 NOTE — Telephone Encounter (Signed)
Hello,  The Pharmacy team is conducting a discharge transitions of care quality improvement initiative. The recommendations below are for your consideration.    Benjamin Estrada is a 49 y.o. male (MRN: [id number], DOB: May 29, 1971) who was recently hospitalized on 09/16/21 for NSTEMI They are anticipated to visit your clinic for post-discharge follow-up and may benefit from assistance with medication initiation and/or access.     Relevant medication access issues which may benefit from further intervention include: n/a   Please consider the following therapy recommendations at follow-up appointment below:   -Consider adding ACEi or ARB if BP elevated and BMET stable  Other relevant medication issues from their recent admission include: n/a   We appreciate your assistance with the implementation of these recommendations. Please let us know if there is anything we can help you with at this time.       Thanks!   Mosetta Anis  09/17/2021 11:18 AM

## 2021-09-17 NOTE — Progress Notes (Signed)
Pt ambulated earlier with MT. No angina, feels well. Discussed with pt and wife MI, restrictions, diet, tobacco cessation, exercise, NTG, and CRPII. Receptive. Thinking about tobacco cessation. Will refer to [facility] CRPII however pt not interested, he prefers to exercise at home. [id number] Ethelda Chick CES, ACSM 11:56 AM 09/17/2021

## 2021-09-17 NOTE — Research (Signed)
Spoke with patient about participating in Clinical Research Study V-Inception.  Patient was given copy of consent  Will Follow-up with the patient on 12/5.

## 2021-09-17 NOTE — Discharge Summary (Addendum)
Discharge Summary    Patient ID: Benjamin Estrada MRN: [id number]; DOB: [birth year]  Admit date: 09/16/2021 Discharge date: 09/17/2021  PCP:  Benita Stabile, MD   [facility] Providers Cardiologist:  Meriam Sprague, MD    (wants to follow up long term in [place])   Discharge Diagnoses    Principal Problem:   NSTEMI (non-ST elevated myocardial infarction) [facility]) Active Problems:   Tobacco use   Hyperlipidemia   Hypertension    Diagnostic Studies/Procedures    Cath: 09/16/21    Prox LAD to Mid LAD lesion is 20% stenosed.   1st Mrg lesion is 100% stenosed.   Prox RCA lesion is 20% stenosed.   The left ventricular systolic function is normal.   LV end diastolic pressure is normal.   The left ventricular ejection fraction is 55-65% by visual estimate.   Single vessel occlusive CAD with 100% first OM. No collaterals Normal overall LV function Normal LVEDP   Plan: recommend medical therapy for completed infarct. Risk factor modification. Anticipate DC in am.  Diagnostic Dominance: Right   _____________   History of Present Illness     Benjamin Estrada is a 50 y.o. male with past medical history of tobacco use who presented to [facility], ED chest pain and found to have non-STEMI.  Reported 2 weeks of worsening chest pain with radiation down into his arms and into his jaw.  These episodes would last anywhere between 10 and 45 minutes.  Initially occurred every other day but had increasing frequency to daily.  Several days prior to admission he was out walking [place] when he developed more severe shortness of breath, chest pain and arm pain which prompted him to see his PCP.  After presenting with his symptoms his PCP referred him to the ER for further evaluation.  Hospital Course     Non-STEMI: High-sensitivity troponin peaked at 1126.  With IV heparin.  Transferred to [facility] underwent cardiac catheterization noted above with 100% stenosis first OM  lesion with no noted collaterals, 20% proximal/mid LAD, 20% proximal RCA.  Normal LV function and LVEDP.  Recommendations for continued medical therapy given his completed infarct. --Aspirin, Plavix, atorvastatin 80 mg daily.  Did add low dose metoprolol 12.5mg  BID prior to discharge.   Hyperlipidemia: LDL 151 --Started on atorvastatin 80 mg daily -- LFTs/FLP in 8 weeks  Tobacco use: smokeless tobacco use  General: Well developed, well nourished, male appearing in no acute distress. Head: Normocephalic, atraumatic.  Neck: Supple without bruits, JVD. Lungs:  Resp regular and unlabored, CTA. Heart: RRR, S1, S2, no S3, S4, or murmur; no rub. Abdomen: Soft, non-tender, non-distended with normoactive bowel sounds. No hepatomegaly. No rebound/guarding. No obvious abdominal masses. Extremities: No clubbing, cyanosis, edema. Distal pedal pulses are 2+ bilaterally. Right cath site stable without bruising or hematoma Neuro: Alert and oriented X 3. Moves all extremities spontaneously. Psych: Normal affect.   Patient was seen by Dr. Eldridge Dace and deemed stable for discharge home. Follow up in the office has been arranged. Medications sent to the [facility] pharmacy prior to discharge. Educated by PharmD prior to discharge.   Did the patient have an acute coronary syndrome (MI, NSTEMI, STEMI, etc) this admission?:  Yes                               AHA/ACC Clinical Performance & Quality Measures: Aspirin prescribed? - Yes ADP Receptor Inhibitor (  Plavix/Clopidogrel, Brilinta/Ticagrelor or Effient/Prasugrel) prescribed (includes medically managed patients)? - Yes Beta Blocker prescribed? - Yes High Intensity Statin (Lipitor 40-80mg  or Crestor 20-40mg ) prescribed? - Yes EF assessed during THIS hospitalization? - Yes For EF <40%, was ACEI/ARB prescribed? - Not Applicable (EF >/= 40%) For EF <40%, Aldosterone Antagonist (Spironolactone or Eplerenone) prescribed? - Not Applicable (EF >/= 40%) Cardiac Rehab  Phase II ordered (including medically managed patients)? - Yes       The patient will be scheduled for a TOC follow up appointment in 10-14 days.  A message has been sent to the [facility] and Scheduling Pool at the office where the patient should be seen for follow up.  _____________  Discharge Vitals Blood pressure 111/86, pulse 65, temperature (!) 97.4 F (36.3 C), temperature source Oral, resp. rate 19, height 6' (1.829 m), weight 98.3 kg, SpO2 97 %.  Filed Weights   09/16/21 1006 09/17/21 0614  Weight: 95.3 kg 98.3 kg    Labs & Radiologic Studies    CBC Recent Labs    09/16/21 1050 09/17/21 0230  WBC 5.8 6.6  NEUTROABS 3.4  --   HGB 15.0 14.1  HCT 44.2 40.2  MCV 88.8 87.6  PLT 210 213   Basic Metabolic Panel Recent Labs    85/27/78 1050 09/16/21 1815 09/17/21 0230  NA 137  --  137  K 3.9  --  4.2  CL 104  --  105  CO2 26  --  23  GLUCOSE 99  --  91  BUN 11  --  11  CREATININE 0.88 0.84 1.01  CALCIUM 9.2  --  9.3   Liver Function Tests Recent Labs    09/16/21 1050  AST 27  ALT 31  ALKPHOS 39  BILITOT 0.8  PROT 6.8  ALBUMIN 4.3   No results for input(s): LIPASE, AMYLASE in the last 72 hours. High Sensitivity Troponin:   Recent Labs  Lab 09/16/21 1050 09/16/21 1233  TROPONINIHS 1,050* 1,126*    BNP Invalid input(s): POCBNP D-Dimer No results for input(s): DDIMER in the last 72 hours. Hemoglobin A1C No results for input(s): HGBA1C in the last 72 hours. Fasting Lipid Panel Recent Labs    09/17/21 0230  CHOL 227*  HDL 35*  LDLCALC 151*  TRIG 206*  CHOLHDL 6.5   Thyroid Function Tests No results for input(s): TSH, T4TOTAL, T3FREE, THYROIDAB in the last 72 hours.  Invalid input(s): FREET3 _____________  CARDIAC CATHETERIZATION  Result Date: 09/16/2021   Prox LAD to Mid LAD lesion is 20% stenosed.   1st Mrg lesion is 100% stenosed.   Prox RCA lesion is 20% stenosed.   The left ventricular systolic function is normal.   LV end diastolic  pressure is normal.   The left ventricular ejection fraction is 55-65% by visual estimate. Single vessel occlusive CAD with 100% first OM. No collaterals Normal overall LV function Normal LVEDP Plan: recommend medical therapy for completed infarct. Risk factor modification. Anticipate DC in am.   DG Chest Port 1 View  Result Date: 09/16/2021 CLINICAL DATA:  Left chest pain, shortness of breath EXAM: PORTABLE CHEST 1 VIEW COMPARISON:  05/07/2006 FINDINGS: The heart size and mediastinal contours are within normal limits. Both lungs are clear. The visualized skeletal structures are unremarkable. IMPRESSION: No active disease. Electronically Signed   By: Judie Petit.  Shick M.D.   On: 09/16/2021 11:06   Disposition   Pt is being discharged home today in good condition.  Follow-up Plans & Appointments  Follow-up Information     [facility] Follow up on 10/01/2021.   Specialty: Cardiology Why: at 1:40pm for your follow up appt with Dr. Jola Babinski information: [address] [place] [address] [phone]               Discharge Instructions     Diet - low sodium heart healthy   Complete by: As directed    Discharge instructions   Complete by: As directed    Radial Site Care Refer to this sheet in the next few weeks. These instructions provide you with information on caring for yourself after your procedure. Your caregiver may also give you more specific instructions. Your treatment has been planned according to current medical practices, but problems sometimes occur. Call your caregiver if you have any problems or questions after your procedure. HOME CARE INSTRUCTIONS You may shower the day after the procedure. Remove the bandage (dressing) and gently wash the site with plain soap and water. Gently pat the site dry.  Do not apply powder or lotion to the site.  Do not submerge the affected site in water for 3 to 5 days.  Inspect the site at least twice  daily.  Do not flex or bend the affected arm for 24 hours.  No lifting over 5 pounds (2.3 kg) for 5 days after your procedure.  Do not drive home if you are discharged the same day of the procedure. Have someone else drive you.  You may drive 24 hours after the procedure unless otherwise instructed by your caregiver.  What to expect: Any bruising will usually fade within 1 to 2 weeks.  Blood that collects in the tissue (hematoma) may be painful to the touch. It should usually decrease in size and tenderness within 1 to 2 weeks.  SEEK IMMEDIATE MEDICAL CARE IF: You have unusual pain at the radial site.  You have redness, warmth, swelling, or pain at the radial site.  You have drainage (other than a small amount of blood on the dressing).  You have chills.  You have a fever or persistent symptoms for more than 72 hours.  You have a fever and your symptoms suddenly get worse.  Your arm becomes pale, cool, tingly, or numb.  You have heavy bleeding from the site. Hold pressure on the site.   Increase activity slowly   Complete by: As directed    No wound care   Complete by: As directed        Discharge Medications   Allergies as of 09/17/2021   No Known Allergies      Medication List     STOP taking these medications    GOODYS BODY PAIN PO       TAKE these medications    aspirin 81 MG EC tablet Take 1 tablet (81 mg total) by mouth daily. Swallow whole. Start taking on: September 18, 2021   atorvastatin 80 MG tablet Commonly known as: LIPITOR Take 1 tablet (80 mg total) by mouth daily at 6 PM.   clopidogrel 75 MG tablet Commonly known as: PLAVIX Take 1 tablet (75 mg total) by mouth daily.   metoprolol tartrate 25 MG tablet Commonly known as: LOPRESSOR Take 1/2 tablet (12.5 mg total) by mouth 2 (two) times daily.   nitroGLYCERIN 0.4 MG SL tablet Commonly known as: NITROSTAT Place 1 tablet (0.4 mg total) under the tongue every 5 (five) minutes x 3 doses as needed  for chest pain.  Outstanding Labs/Studies   FLP/LFTs in 8 weeks  Duration of Discharge Encounter   Greater than 30 minutes including physician time.  Signed, Laverda Page, NP 09/17/2021, 11:37 AM  I have examined the patient and reviewed assessment and plan and discussed with patient.  Agree with above as stated.    GEN: Well nourished, well developed, in no acute distress  HEENT: normal  Neck: no JVD, carotid bruits, or masses Cardiac: RRR; no murmurs, rubs, or gallops,no edema  Respiratory:  clear to auscultation bilaterally, normal work of breathing GI: soft, nontender, nondistended,  MS: no deformity or atrophy ; right radial site without hematoma, 2+ right radial pulse Skin: warm and dry, no rash Neuro:  Strength and sensation are intact Psych: euthymic mood, full affect   We spoke about recognizing his angina if it occurs in the future.  Continue aggressive secondary prevention with aspirin and Plavix, lipid-lowering therapy.  Healthy diet and regular exercise encouraged.  No further angina.  Okay for discharge.  Okay to return to work after about a week.  Lance Muss

## 2021-09-18 LAB — TROPONIN I (HIGH SENSITIVITY): Troponin I (High Sensitivity): 1126 ng/L (ref <18)

## 2021-09-22 ENCOUNTER — Encounter: Payer: Self-pay | Admitting: *Deleted

## 2021-09-22 DIAGNOSIS — Z006 Encounter for examination for normal comparison and control in clinical research program: Secondary | ICD-10-CM

## 2021-09-22 NOTE — Research (Signed)
Follow-up phone call with patient about participation in v-inception research study.  Patient stated that he and his wife have been discussing it and have not decided yet on participating.  Will place a follow-up call to patient on 12/9  Evern Bio, RN BSN [facility] Cardiovascular Research & Education Direct Line: [phone]

## 2021-09-22 NOTE — Progress Notes (Signed)
Cardiology Office Note:    Date:  10/01/2021   ID:  Benjamin Estrada, DOB [birth year], MRN [id number]  PCP:  Benita Stabile, MD   [facility] Providers Cardiologist:  Meriam Sprague, MD {   Referring MD: Benita Stabile, MD    History of Present Illness:    Benjamin Estrada is a 50 y.o. male with a hx of tobacco abuse, HLD and recently admission for NSTEMI found to have 100% occlusion of OM1 managed medically who now presents to clinic for follow-up.  Patient was admitted from 09/16/21 to 09/17/21 after he presented to the ER with chest pain found to have trop 1126. Patient underwent coronary angiography on 11/29 which revealed 100% OM1, 20% prox-mid LAD, 20% prox RCA. He was recommended for medical management. He was discharged home on ASA, plavix, lipitor and metoprolol. He now presents to clinic for follow-up.  Today, the patient states that he feels overall well. Continues to have intermittent arm pain with heavy exertion but resolves with rest. No chest pain and SOB is significantly improved. Has not used nitroglycerin. Blood pressure is well controlled. Cut back on tobacco chewing significantly. Has been compliant with all medications. Returned to work and remains very active.  Past Medical History:  Diagnosis Date   High cholesterol     Past Surgical History:  Procedure Laterality Date   HERNIA REPAIR     LEFT HEART CATH AND CORONARY ANGIOGRAPHY N/A 09/16/2021   Procedure: LEFT HEART CATH AND CORONARY ANGIOGRAPHY;  Surgeon: [place], Peter M, MD;  Location: [facility] INVASIVE CV LAB;  Service: Cardiovascular;  Laterality: N/A;   SKIN SURGERY     MRSA taken off the left arm    Current Medications: Current Meds  Medication Sig   aspirin 81 MG EC tablet Take 1 tablet (81 mg total) by mouth daily. Swallow whole.   atorvastatin (LIPITOR) 80 MG tablet Take 1 tablet (80 mg total) by mouth daily at 6 PM.   clopidogrel (PLAVIX) 75 MG tablet Take 1 tablet (75 mg total) by mouth daily.    ezetimibe (ZETIA) 10 MG tablet Take 1 tablet (10 mg total) by mouth daily.   isosorbide mononitrate (IMDUR) 30 MG 24 hr tablet Take 0.5 tablets (15 mg total) by mouth daily.   metoprolol succinate (TOPROL XL) 25 MG 24 hr tablet Take 0.5 tablets (12.5 mg total) by mouth daily.   nitroGLYCERIN (NITROSTAT) 0.4 MG SL tablet Place 1 tablet (0.4 mg total) under the tongue every 5 (five) minutes x 3 doses as needed for chest pain.   [DISCONTINUED] metoprolol tartrate (LOPRESSOR) 25 MG tablet Take 1/2 tablet (12.5 mg total) by mouth 2 (two) times daily.     Allergies:   Patient has no known allergies.   Social History   Socioeconomic History   Marital status: Married    Spouse name: Not on file   Number of children: Not on file   Years of education: Not on file   Highest education level: Not on file  Occupational History   Not on file  Tobacco Use   Smoking status: Never   Smokeless tobacco: Current    Types: Chew  Vaping Use   Vaping Use: Never used  Substance and Sexual Activity   Alcohol use: Never   Drug use: Never   Sexual activity: Not on file  Other Topics Concern   Not on file  Social History Narrative   Not on file   Social Determinants of Health  Financial Resource Strain: Not on file  Food Insecurity: Not on file  Transportation Needs: Not on file  Physical Activity: Not on file  Stress: Not on file  Social Connections: Not on file     Family History: The patient's family history includes CVA in his mother; Cancer in his father; Heart attack in his father.  ROS:   Please see the history of present illness.     All other systems reviewed and are negative.  EKGs/Labs/Other Studies Reviewed:    The following studies were reviewed today: Cath: 09/16/21     Prox LAD to Mid LAD lesion is 20% stenosed.   1st Mrg lesion is 100% stenosed.   Prox RCA lesion is 20% stenosed.   The left ventricular systolic function is normal.   LV end diastolic pressure is  normal.   The left ventricular ejection fraction is 55-65% by visual estimate.   Single vessel occlusive CAD with 100% first OM. No collaterals Normal overall LV function Normal LVEDP   Plan: recommend medical therapy for completed infarct. Risk factor modification. Anticipate DC in am.   Diagnostic Dominance: Right    EKG:  No ECG today  Recent Labs: 09/16/2021: ALT 31 09/17/2021: BUN 11; Creatinine, Ser 1.01; Hemoglobin 14.1; Platelets 213; Potassium 4.2; Sodium 137  Recent Lipid Panel    Component Value Date/Time   CHOL 227 (H) 09/17/2021 0230   TRIG 206 (H) 09/17/2021 0230   HDL 35 (L) 09/17/2021 0230   CHOLHDL 6.5 09/17/2021 0230   VLDL 41 (H) 09/17/2021 0230   LDLCALC 151 (H) 09/17/2021 0230     Risk Assessment/Calculations:           Physical Exam:    VS:  BP 120/78   Pulse 60   Wt 209 lb (94.8 kg)   SpO2 98%   BMI 28.35 kg/m     Wt Readings from Last 3 Encounters:  10/01/21 209 lb (94.8 kg)  09/17/21 216 lb 11.4 oz (98.3 kg)     GEN:  Well nourished, well developed in no acute distress HEENT: Normal NECK: No JVD; No carotid bruits CARDIAC: RRR, no murmurs, rubs, gallops RESPIRATORY:  Clear to auscultation without rales, wheezing or rhonchi  ABDOMEN: Soft, non-tender, non-distended MUSCULOSKELETAL:  No edema; No deformity  SKIN: Warm and dry NEUROLOGIC:  Alert and oriented x 3 PSYCHIATRIC:  Normal affect   ASSESSMENT:    1. NSTEMI (non-ST elevated myocardial infarction) (HCC)   2. Mixed hyperlipidemia   3. Tobacco use    PLAN:    In order of problems listed above:  #NSTEMI: Patient with admission in 08/2021 with chest pain found to have NSTEMI with cath revealing 100% OM1 occlusion. Managed medially. Currently with arm pain with heavy exertion but otherwise doing well. Compliant with all medications. Remains active at work.  -Check TTE to assess EF after MI -Continue ASA 81mg  daily, plavix 75mg  daily -Continue lipitor 80mg   daily -Change metop to 12.5mg  XL daily -Start imdur 15mg  daily -Add ACE/ARB as able -Declined cardiac rehab -Tobacco cessation counseling  #HLD: LDL 151 not on statin. -Continue lipitor 80mg  daily -Add zetia 10mg  daily -Repeat lipids in 6 weeks -Goal LDL <55  #Tobacco use: Has significantly cut back. Discussed importance of cessation. -Encourage cessation    Cardiac Rehabilitation Eligibility Assessment  The patient has declined or is not appropriate for cardiac rehabilitation.       Medication Adjustments/Labs and Tests Ordered: Current medicines are reviewed at length with the patient today.  Concerns regarding medicines are outlined above.  Orders Placed This Encounter  Procedures   Lipid panel   ECHOCARDIOGRAM COMPLETE    Meds ordered this encounter  Medications   metoprolol succinate (TOPROL XL) 25 MG 24 hr tablet    Sig: Take 0.5 tablets (12.5 mg total) by mouth daily.    Dispense:  45 tablet    Refill:  3    10/01/21 lopressor changed to toprol   ezetimibe (ZETIA) 10 MG tablet    Sig: Take 1 tablet (10 mg total) by mouth daily.    Dispense:  90 tablet    Refill:  3   isosorbide mononitrate (IMDUR) 30 MG 24 hr tablet    Sig: Take 0.5 tablets (15 mg total) by mouth daily.    Dispense:  45 tablet    Refill:  3     Patient Instructions  Medication Instructions:   STOP Lopressor   START Toprol XL 12.5 mg daily   START Zetia 10 mg daily   START Imdur 15 mg daily   Labwork: Fasting Lipids in 6-8 weeks  (1/25-2/8)   Testing/Procedures: Your physician has requested that you have an echocardiogram. Echocardiography is a painless test that uses sound waves to create images of your heart. It provides your doctor with information about the size and shape of your heart and how well your heart's chambers and valves are working. This procedure takes approximately one hour. There are no restrictions for this procedure.   Follow-Up: 6 months  Any Other  Special Instructions Will Be Listed Below (If Applicable).  If you need a refill on your cardiac medications before your next appointment, please call your pharmacy.   Signed, Meriam Sprague, MD  10/01/2021 2:18 PM    [facility]

## 2021-09-26 ENCOUNTER — Encounter: Payer: Self-pay | Admitting: *Deleted

## 2021-09-26 DIAGNOSIS — Z006 Encounter for examination for normal comparison and control in clinical research program: Secondary | ICD-10-CM

## 2021-09-26 NOTE — Research (Addendum)
Left message for patient about participation in research study awaiting call back   Patient returned call, patient has a appt with his cardiologist on Wednesday next week he asked me to follow up after that appointment with Dr. Shari Prows    Evern Bio, RN BSN [facility] Cardiovascular Research & Education Direct Line: [phone]

## 2021-10-01 ENCOUNTER — Telehealth: Payer: Self-pay | Admitting: *Deleted

## 2021-10-01 ENCOUNTER — Other Ambulatory Visit: Payer: Self-pay

## 2021-10-01 ENCOUNTER — Encounter: Payer: Self-pay | Admitting: Cardiology

## 2021-10-01 ENCOUNTER — Ambulatory Visit (INDEPENDENT_AMBULATORY_CARE_PROVIDER_SITE_OTHER): Payer: BC Managed Care – PPO | Admitting: Cardiology

## 2021-10-01 VITALS — BP 120/78 | HR 60 | Wt 209.0 lb

## 2021-10-01 DIAGNOSIS — Z720 Tobacco use: Secondary | ICD-10-CM

## 2021-10-01 DIAGNOSIS — E782 Mixed hyperlipidemia: Secondary | ICD-10-CM

## 2021-10-01 DIAGNOSIS — Z006 Encounter for examination for normal comparison and control in clinical research program: Secondary | ICD-10-CM

## 2021-10-01 DIAGNOSIS — I214 Non-ST elevation (NSTEMI) myocardial infarction: Secondary | ICD-10-CM | POA: Diagnosis not present

## 2021-10-01 MED ORDER — EZETIMIBE 10 MG PO TABS: 10.0000 mg | ORAL_TABLET | Freq: Every day | ORAL | 3 refills | Status: DC

## 2021-10-01 MED ORDER — METOPROLOL SUCCINATE ER 25 MG PO TB24: 12.5000 mg | ORAL_TABLET | Freq: Every day | ORAL | 3 refills | Status: DC

## 2021-10-01 MED ORDER — ISOSORBIDE MONONITRATE ER 30 MG PO TB24: 15.0000 mg | ORAL_TABLET | Freq: Every day | ORAL | 3 refills | Status: DC

## 2021-10-01 NOTE — Patient Instructions (Signed)
Medication Instructions:   STOP Lopressor   START Toprol XL 12.5 mg daily   START Zetia 10 mg daily   START Imdur 15 mg daily   Labwork: Fasting Lipids in 6-8 weeks  (1/25-2/8)   Testing/Procedures: Your physician has requested that you have an echocardiogram. Echocardiography is a painless test that uses sound waves to create images of your heart. It provides your doctor with information about the size and shape of your heart and how well your hearts chambers and valves are working. This procedure takes approximately one hour. There are no restrictions for this procedure.   Follow-Up: 6 months  Any Other Special Instructions Will Be Listed Below (If Applicable).  If you need a refill on your cardiac medications before your next appointment, please call your pharmacy.

## 2021-10-01 NOTE — Telephone Encounter (Signed)
Follow up phone call placed to patient about participation in the research study [employer].  Patient had a visit with Dr. Shari Prows today to discuss potential enrollment.  Patient stated that Dr. Shari Prows recommended waiting until next recheck of his lipids at the end of January before deciding to enroll into the study.  Patients end of screening period for the study is on 10/21/21.  With this information, the patient will not be eligible to enroll into the study.  Patient verbalized understanding.  Research will sign off on this patient at this time   Evern Bio, RN BSN [facility] Cardiovascular Research & Education Direct Line: [phone]

## 2021-10-03 ENCOUNTER — Telehealth (HOSPITAL_COMMUNITY): Payer: Self-pay

## 2021-10-03 ENCOUNTER — Other Ambulatory Visit (HOSPITAL_COMMUNITY): Payer: Self-pay

## 2021-10-03 NOTE — Telephone Encounter (Signed)
Pharmacy Transitions of Care Follow-up Telephone Call  Date of discharge: 09/17/21  Discharge Diagnosis: NSTEMI  How have you been since you were released from the hospital? Good   Medication changes made at discharge:  - START: aspirin, plavix, lipitor  - STOPPED: lopressor stopped 12/14  - CHANGED: some meds added at appt on 14th-zetia, isosorbide, toprol  Medication changes verified by the patient? yes    Medication Accessibility:  Home Pharmacy: [facility] [address]   Was the patient provided with refills on discharged medications? yes   Have all prescriptions been transferred from [facility] to home pharmacy? yes   Is the patient able to afford medications? yes Notable copays:  Eligible patient assistance:     Medication Review: ( CLOPIDOGREL (PLAVIX) Clopidogrel 75 mg once daily.  - Educated patient on expected duration of therapy of  with clopidogrel. Advised patient that aspirin will be continued indefinitely.  - Reviewed potential DDIs with patient  - Advised patient of medications to avoid (NSAIDs, ASA)  - Educated that Tylenol (acetaminophen) will be the preferred analgesic to prevent risk of bleeding  - Emphasized importance of monitoring for signs and symptoms of bleeding (abnormal bruising, prolonged bleeding, nose bleeds, bleeding from gums, discolored urine, black tarry stools)  - Advised patient to alert all providers of anticoagulation therapy prior to starting a new medication or having a procedure   Follow-up Appointments:  PCP Hospital f/u appt confirmed? Yes-He saw the cardiologist on the 14th.   If their condition worsens, is the pt aware to call PCP or go to the Emergency Dept.? yes  Final Patient Assessment: Benjamin Estrada is feeling fine. He is taking his medications every day and has already had them sent to his pharmacy [facility]. He reports no bleeding/bruising. His cardilogist changed some of his medications at his visit on the 14th. He is aware to avoid  OTC NSAIDS.

## 2021-11-10 ENCOUNTER — Other Ambulatory Visit: Payer: Self-pay

## 2021-11-10 ENCOUNTER — Ambulatory Visit (HOSPITAL_COMMUNITY)
Admission: RE | Admit: 2021-11-10 | Discharge: 2021-11-10 | Disposition: A | Discharge status: Home / Self Care | Payer: BLUE CROSS/BLUE SHIELD | Source: Ambulatory Visit | Attending: Cardiology | Admitting: Cardiology

## 2021-11-10 DIAGNOSIS — I214 Non-ST elevation (NSTEMI) myocardial infarction: Secondary | ICD-10-CM | POA: Insufficient documentation

## 2021-11-10 LAB — ECHOCARDIOGRAM COMPLETE
AR max vel: 2.65 cm2
AV Area VTI: 2.36 cm2
AV Area mean vel: 2.4 cm2
AV Mean grad: 2 mmHg
AV Peak grad: 4.8 mmHg
Ao pk vel: 1.09 m/s
Area-P 1/2: 2.9 cm2
Calc EF: 57.3 %
MV VTI: 1.99 cm2
S' Lateral: 3.4 cm
Single Plane A2C EF: 57.6 %
Single Plane A4C EF: 52.9 %

## 2021-11-10 NOTE — Progress Notes (Signed)
*  PRELIMINARY RESULTS* Echocardiogram 2D Echocardiogram has been performed.  Benjamin Estrada 11/10/2021, 3:46 PM

## 2021-11-12 ENCOUNTER — Other Ambulatory Visit: Payer: Self-pay

## 2021-11-12 ENCOUNTER — Other Ambulatory Visit (HOSPITAL_COMMUNITY)
Admission: RE | Admit: 2021-11-12 | Discharge: 2021-11-12 | Disposition: A | Discharge status: Home / Self Care | Payer: BLUE CROSS/BLUE SHIELD | Source: Ambulatory Visit | Attending: Cardiology | Admitting: Cardiology

## 2021-11-12 DIAGNOSIS — E782 Mixed hyperlipidemia: Secondary | ICD-10-CM | POA: Insufficient documentation

## 2021-11-12 LAB — LIPID PANEL
Cholesterol: 104 mg/dL (ref 0–200)
HDL: 33 mg/dL — ABNORMAL LOW (ref >40)
LDL Cholesterol: 53 mg/dL (ref 0–99)
Total CHOL/HDL Ratio: 3.2 RATIO
Triglycerides: 90 mg/dL (ref <150)
VLDL: 18 mg/dL (ref 0–40)

## 2021-11-13 ENCOUNTER — Telehealth: Payer: Self-pay

## 2021-11-13 NOTE — Telephone Encounter (Signed)
Patient notified and verbalized understanding. Patient had no questions or concerns at this time.  

## 2021-11-13 NOTE — Telephone Encounter (Signed)
-----   Message from Meriam Sprague, MD sent at 11/12/2021  8:57 PM EST ----- His cholesterol looks very well controlled! His LDL dropped from 151 to 53! This is great. No changes in medications.

## 2021-12-19 ENCOUNTER — Other Ambulatory Visit (HOSPITAL_COMMUNITY): Payer: Self-pay

## 2021-12-21 ENCOUNTER — Other Ambulatory Visit: Payer: Self-pay | Admitting: Cardiology

## 2022-06-14 ENCOUNTER — Other Ambulatory Visit: Payer: Self-pay | Admitting: Cardiology

## 2022-07-14 ENCOUNTER — Other Ambulatory Visit: Payer: Self-pay | Admitting: *Deleted

## 2022-07-14 MED ORDER — ATORVASTATIN CALCIUM 80 MG PO TABS: ORAL_TABLET | ORAL | 0 refills | Status: DC

## 2022-09-14 ENCOUNTER — Other Ambulatory Visit: Payer: Self-pay

## 2022-09-14 MED ORDER — ISOSORBIDE MONONITRATE ER 30 MG PO TB24
15.0000 mg | ORAL_TABLET | Freq: Every day | ORAL | 0 refills | Status: DC
Start: 2022-09-14 — End: 2022-10-13

## 2022-09-14 MED ORDER — METOPROLOL SUCCINATE ER 25 MG PO TB24: 12.5000 mg | ORAL_TABLET | Freq: Every day | ORAL | 0 refills | Status: DC

## 2022-10-13 ENCOUNTER — Other Ambulatory Visit: Payer: Self-pay

## 2022-10-13 MED ORDER — ISOSORBIDE MONONITRATE ER 30 MG PO TB24: 15.0000 mg | ORAL_TABLET | Freq: Every day | ORAL | 0 refills | Status: DC

## 2022-10-13 MED ORDER — METOPROLOL SUCCINATE ER 25 MG PO TB24: 12.5000 mg | ORAL_TABLET | Freq: Every day | ORAL | 0 refills | Status: DC

## 2022-10-13 NOTE — Telephone Encounter (Signed)
Seen 09/2021, was to return in 6 months (June 2023)    Overdue, needs apt for refills   Will message scheduling to make apt

## 2022-11-02 ENCOUNTER — Ambulatory Visit: Payer: BLUE CROSS/BLUE SHIELD | Admitting: Internal Medicine

## 2022-11-20 ENCOUNTER — Ambulatory Visit: Payer: Commercial Managed Care - HMO | Attending: Internal Medicine | Admitting: Internal Medicine

## 2022-11-20 ENCOUNTER — Encounter: Payer: Self-pay | Admitting: Internal Medicine

## 2022-11-20 VITALS — BP 124/82 | HR 58 | Ht 72.0 in | Wt 220.0 lb

## 2022-11-20 DIAGNOSIS — I2510 Atherosclerotic heart disease of native coronary artery without angina pectoris: Secondary | ICD-10-CM

## 2022-11-20 DIAGNOSIS — I214 Non-ST elevation (NSTEMI) myocardial infarction: Secondary | ICD-10-CM | POA: Diagnosis not present

## 2022-11-20 MED ORDER — ISOSORBIDE MONONITRATE ER 30 MG PO TB24: 15.0000 mg | ORAL_TABLET | Freq: Every day | ORAL | 3 refills | Status: DC

## 2022-11-20 MED ORDER — NITROGLYCERIN 0.4 MG SL SUBL: 0.4000 mg | SUBLINGUAL_TABLET | SUBLINGUAL | 3 refills | Status: DC | PRN

## 2022-11-20 MED ORDER — ATORVASTATIN CALCIUM 80 MG PO TABS: ORAL_TABLET | ORAL | 3 refills | Status: AC

## 2022-11-20 MED ORDER — METOPROLOL SUCCINATE ER 25 MG PO TB24: 12.5000 mg | ORAL_TABLET | Freq: Every day | ORAL | 3 refills | Status: AC

## 2022-11-20 MED ORDER — CLOPIDOGREL BISULFATE 75 MG PO TABS: 75.0000 mg | ORAL_TABLET | Freq: Every day | ORAL | 3 refills | Status: AC

## 2022-11-20 MED ORDER — EZETIMIBE 10 MG PO TABS: 10.0000 mg | ORAL_TABLET | Freq: Every day | ORAL | 3 refills | Status: DC

## 2022-11-20 MED ORDER — ASPIRIN 81 MG PO TBEC: 81.0000 mg | DELAYED_RELEASE_TABLET | Freq: Every day | ORAL | 2 refills | Status: DC

## 2022-11-20 NOTE — Progress Notes (Signed)
Cardiology Office Note  Date: 11/20/2022   ID: Benjamin Estrada, DOB 12-04-1970, MRN [id number]  PCP:  Celene Squibb, MD  Cardiologist:  Chalmers Guest, MD Electrophysiologist:  None   Reason for Office Visit: CAD follow-up   History of Present Illness: Benjamin Estrada is a 52 y.o. male known to have CAD manifested by NSTEMI in 08/2021 s/p LHC showed 100% OM1, 20% proximal to mid LAD, 20% proximal RCA (on medical management and no PCI performed),, HLD, nicotine abuse (does not smoke cigarettes but chews tobacco) presents to the clinic for follow-up visit.  Denies any interval ER visits/hospitalizations for chest pain.  Denies any angina but has chest pain once in a while.  He performs strenuous activities at work and does not get any chest pains. Denies any breathing issues, dizziness, lightness, syncope, leg swelling.  He does not smoke cigarettes but chews tobacco. He said he did not get the desire to quit chewing tobacco so far.  He stated he started to chew tobacco since he was 52 years old and hence it was difficult for him to quit. He is compliant with his medications and has no side effects. However he started to take one of the antiplatelet agents every other day and does not remember which medication it is.  Past Medical History:  Diagnosis Date   High cholesterol     Past Surgical History:  Procedure Laterality Date   HERNIA REPAIR     LEFT HEART CATH AND CORONARY ANGIOGRAPHY N/A 09/16/2021   Procedure: LEFT HEART CATH AND CORONARY ANGIOGRAPHY;  Surgeon: Martinique, Peter M, MD;  Location: [place] CV LAB;  Service: Cardiovascular;  Laterality: N/A;   SKIN SURGERY     MRSA taken off the left arm    Current Outpatient Medications  Medication Sig Dispense Refill   aspirin EC 81 MG tablet Take 1 tablet (81 mg total) by mouth daily. Swallow whole. 90 tablet 2   atorvastatin (LIPITOR) 80 MG tablet TAKE 1 TABLET BY MOUTH EVERY DAY AT 6PM 90 tablet 3   clopidogrel (PLAVIX) 75  MG tablet Take 1 tablet (75 mg total) by mouth daily. 90 tablet 3   ezetimibe (ZETIA) 10 MG tablet Take 1 tablet (10 mg total) by mouth daily. 90 tablet 3   isosorbide mononitrate (IMDUR) 30 MG 24 hr tablet Take 0.5 tablets (15 mg total) by mouth daily. Please schedule an appointment for further refills. 45 tablet 3   metoprolol succinate (TOPROL XL) 25 MG 24 hr tablet Take 0.5 tablets (12.5 mg total) by mouth daily. 45 tablet 3   nitroGLYCERIN (NITROSTAT) 0.4 MG SL tablet Place 1 tablet (0.4 mg total) under the tongue every 5 (five) minutes x 3 doses as needed for chest pain. 30 tablet 3   No current facility-administered medications for this visit.   Allergies:  Patient has no known allergies.   Social History: The patient  reports that he has never smoked. His smokeless tobacco use includes chew. He reports that he does not drink alcohol and does not use drugs.   Family History: The patient's family history includes CVA in his mother; Cancer in his father; Heart attack in his father.   ROS:  Please see the history of present illness. Otherwise, complete review of systems is positive for none.  All other systems are reviewed and negative.   Physical Exam: VS:  BP 124/82   Pulse (!) 58   Ht 6' (1.829 m)   Wt 220  lb (99.8 kg)   SpO2 97%   BMI 29.84 kg/m , BMI Body mass index is 29.84 kg/m.  Wt Readings from Last 3 Encounters:  11/20/22 220 lb (99.8 kg)  10/01/21 209 lb (94.8 kg)  09/17/21 216 lb 11.4 oz (98.3 kg)    General: Patient appears comfortable at rest. HEENT: Conjunctiva and lids normal, oropharynx clear with moist mucosa. Neck: Supple, no elevated JVP or carotid bruits, no thyromegaly. Lungs: Clear to auscultation, nonlabored breathing at rest. Cardiac: Regular rate and rhythm, no S3 or significant systolic murmur, no pericardial rub. Abdomen: Soft, nontender, no hepatomegaly, bowel sounds present, no guarding or rebound. Extremities: No pitting edema, distal pulses  2+. Skin: Warm and dry. Musculoskeletal: No kyphosis. Neuropsychiatric: Alert and oriented x3, affect grossly appropriate.  ECG:  An ECG dated 11/20/2022 was personally reviewed today and demonstrated:  NSR and no ST-T changes  Recent Labwork: No results found for requested labs within last 365 days.     Component Value Date/Time   CHOL 104 11/12/2021 0813   TRIG 90 11/12/2021 0813   HDL 33 (L) 11/12/2021 0813   CHOLHDL 3.2 11/12/2021 0813   VLDL 18 11/12/2021 0813   LDLCALC 53 11/12/2021 0813    Other Studies Reviewed Today:   Assessment and Plan: Patient is a 52 year old M known to have CAD manifested by NSTEMI in 08/2021 s/p LHC showed 100% OM1, 20% proximal to mid LAD, 20% proximal RCA (on medical management and no PCI performed),, HLD, nicotine abuse (does not smoke cigarettes but chews tobacco) presents to the clinic for follow-up visit.  # CAD manifested by NSTEMI in 08/2021 s/p LHC showed 100% OM1, 20% proximal to mid LAD, 20% proximal RCA (on medical management and no PCI performed), currently angina free -Continue aspirin 81 mg once daily and Plavix 75 mg once daily. He takes 1 of these antiplatelets every other day due to frequent bruising. -Continue high intensity statin, atorvastatin 80 mg nightly and ezetimibe 10 mg once daily -Continue Imdur 15 mg once daily and metoprolol succinate 12.5 mg once daily -SL NTG 0.4 mg as needed -ER precautions for chest pain  # HLD, at goal -Continue high intensity statin, atorvastatin 80 mg nightly and estimate 10 mg once daily.  # Tobacco abuse -Patient chews tobacco and has been chewing tobacco since he was 52 years old.  He reported that he does not have any desire to quit smoking at this time.  counseled patient on the dangers of tobacco use, advised patient to stop smoking, and reviewed strategies to maximize success .  He was understanding and the need to quit chewing tobacco.  I have spent a total of 33 minutes with patient  reviewing chart , telemetry, EKGs, labs and examining patient as well as establishing an assessment and plan that was discussed with the patient.  > 50% of time was spent in direct patient care.      Medication Adjustments/Labs and Tests Ordered: Current medicines are reviewed at length with the patient today.  Concerns regarding medicines are outlined above.   Tests Ordered: Orders Placed This Encounter  Procedures   EKG 12-Lead    Medication Changes: Meds ordered this encounter  Medications   aspirin EC 81 MG tablet    Sig: Take 1 tablet (81 mg total) by mouth daily. Swallow whole.    Dispense:  90 tablet    Refill:  2   atorvastatin (LIPITOR) 80 MG tablet    Sig: TAKE 1 TABLET BY MOUTH  EVERY DAY AT 6PM    Dispense:  90 tablet    Refill:  3   clopidogrel (PLAVIX) 75 MG tablet    Sig: Take 1 tablet (75 mg total) by mouth daily.    Dispense:  90 tablet    Refill:  3   ezetimibe (ZETIA) 10 MG tablet    Sig: Take 1 tablet (10 mg total) by mouth daily.    Dispense:  90 tablet    Refill:  3   isosorbide mononitrate (IMDUR) 30 MG 24 hr tablet    Sig: Take 0.5 tablets (15 mg total) by mouth daily. Please schedule an appointment for further refills.    Dispense:  45 tablet    Refill:  3   metoprolol succinate (TOPROL XL) 25 MG 24 hr tablet    Sig: Take 0.5 tablets (12.5 mg total) by mouth daily.    Dispense:  45 tablet    Refill:  3   nitroGLYCERIN (NITROSTAT) 0.4 MG SL tablet    Sig: Place 1 tablet (0.4 mg total) under the tongue every 5 (five) minutes x 3 doses as needed for chest pain.    Dispense:  30 tablet    Refill:  3    Disposition:  Follow up  1 year  Signed, Danice Dippolito Fidel Levy, MD, 11/20/2022 10:40 AM    [facility] at [facility] [address] [place], [place] [address]

## 2022-11-20 NOTE — Patient Instructions (Signed)
Medication Instructions:  Your physician recommends that you continue on your current medications as directed. Please refer to the Current Medication list given to you today.   Labwork: None  Testing/Procedures: None  Follow-Up: Follow up with Dr. Mallipeddi in 1 year.   Any Other Special Instructions Will Be Listed Below (If Applicable).     If you need a refill on your cardiac medications before your next appointment, please call your pharmacy.  

## 2022-12-10 ENCOUNTER — Encounter: Payer: Self-pay | Admitting: *Deleted

## 2023-01-17 NOTE — Progress Notes (Deleted)
Referring Provider: Cecile Sheerer, NP  Primary Care Physician:  Celene Squibb, MD Primary Gastroenterologist:  Dr. Gala Romney  No chief complaint on file.   HPI:   Benjamin Estrada is a 52 y.o. male presenting today at the request of Cecile Sheerer, NP for consult colonoscopy.  Office visit recommended due to blood thinner.  He has history significant for CAD, NSTEMI in November 2022 managed medically, HLD.  Today:    Past Medical History:  Diagnosis Date   High cholesterol     Past Surgical History:  Procedure Laterality Date   HERNIA REPAIR     LEFT HEART CATH AND CORONARY ANGIOGRAPHY N/A 09/16/2021   Procedure: LEFT HEART CATH AND CORONARY ANGIOGRAPHY;  Surgeon: [place], Peter M, MD;  Location: [facility];  Service: Cardiovascular;  Laterality: N/A;   SKIN SURGERY     MRSA taken off the left arm    Current Outpatient Medications  Medication Sig Dispense Refill   aspirin EC 81 MG tablet Take 1 tablet (81 mg total) by mouth daily. Swallow whole. 90 tablet 2   atorvastatin (LIPITOR) 80 MG tablet TAKE 1 TABLET BY MOUTH EVERY DAY AT 6PM 90 tablet 3   clopidogrel (PLAVIX) 75 MG tablet Take 1 tablet (75 mg total) by mouth daily. 90 tablet 3   ezetimibe (ZETIA) 10 MG tablet Take 1 tablet (10 mg total) by mouth daily. 90 tablet 3   isosorbide mononitrate (IMDUR) 30 MG 24 hr tablet Take 0.5 tablets (15 mg total) by mouth daily. Please schedule an appointment for further refills. 45 tablet 3   metoprolol succinate (TOPROL XL) 25 MG 24 hr tablet Take 0.5 tablets (12.5 mg total) by mouth daily. 45 tablet 3   nitroGLYCERIN (NITROSTAT) 0.4 MG SL tablet Place 1 tablet (0.4 mg total) under the tongue every 5 (five) minutes x 3 doses as needed for chest pain. 30 tablet 3   No current facility-administered medications for this visit.    Allergies as of 01/18/2023   (No Known Allergies)    Family History  Problem Relation Age of Onset   CVA Mother    Cancer Father    Heart attack  Father     Social History   Socioeconomic History   Marital status: Married    Spouse name: Not on file   Number of children: Not on file   Years of education: Not on file   Highest education level: Not on file  Occupational History   Not on file  Tobacco Use   Smoking status: Never   Smokeless tobacco: Current    Types: Chew  Vaping Use   Vaping Use: Never used  Substance and Sexual Activity   Alcohol use: Never   Drug use: Never   Sexual activity: Not on file  Other Topics Concern   Not on file  Social History Narrative   Not on file   Social Determinants of Health   Financial Resource Strain: Not on file  Food Insecurity: Not on file  Transportation Needs: Not on file  Physical Activity: Not on file  Stress: Not on file  Social Connections: Not on file  Intimate Partner Violence: Not on file    Review of Systems: Gen: Denies any fever, chills, fatigue, weight loss, lack of appetite.  CV: Denies chest pain, heart palpitations, peripheral edema, syncope.  Resp: Denies shortness of breath at rest or with exertion. Denies wheezing or cough.  GI: Denies dysphagia or odynophagia. Denies jaundice, hematemesis,  fecal incontinence. GU : Denies urinary burning, urinary frequency, urinary hesitancy MS: Denies joint pain, muscle weakness, cramps, or limitation of movement.  Derm: Denies rash, itching, dry skin Psych: Denies depression, anxiety, memory loss, and confusion Heme: Denies bruising, bleeding, and enlarged lymph nodes.  Physical Exam: There were no vitals taken for this visit. General:   Alert and oriented. Pleasant and cooperative. Well-nourished and well-developed.  Head:  Normocephalic and atraumatic. Eyes:  Without icterus, sclera clear and conjunctiva pink.  Ears:  Normal auditory acuity. Lungs:  Clear to auscultation bilaterally. No wheezes, rales, or rhonchi. No distress.  Heart:  S1, S2 present without murmurs appreciated.  Abdomen:  +BS, soft,  non-tender and non-distended. No HSM noted. No guarding or rebound. No masses appreciated.  Rectal:  Deferred  Msk:  Symmetrical without gross deformities. Normal posture. Extremities:  Without edema. Neurologic:  Alert and  oriented x4;  grossly normal neurologically. Skin:  Intact without significant lesions or rashes. Psych:  Alert and cooperative. Normal mood and affect.    Assessment:     Plan:  ***   Aliene Altes, PA-C [facility] 01/18/2023

## 2023-01-18 ENCOUNTER — Ambulatory Visit: Payer: Commercial Managed Care - HMO | Admitting: Gastroenterology

## 2023-01-18 ENCOUNTER — Encounter: Payer: Self-pay | Admitting: Gastroenterology

## 2023-11-19 ENCOUNTER — Ambulatory Visit: Payer: Self-pay | Admitting: Internal Medicine

## 2024-01-21 ENCOUNTER — Ambulatory Visit: Payer: Self-pay | Admitting: Internal Medicine

## 2024-03-01 ENCOUNTER — Ambulatory Visit: Payer: Self-pay | Attending: Internal Medicine | Admitting: Internal Medicine

## 2024-03-01 ENCOUNTER — Encounter: Payer: Self-pay | Admitting: Internal Medicine

## 2024-03-01 VITALS — BP 130/88 | HR 68 | Ht 72.0 in | Wt 220.4 lb

## 2024-03-01 DIAGNOSIS — I214 Non-ST elevation (NSTEMI) myocardial infarction: Secondary | ICD-10-CM | POA: Diagnosis not present

## 2024-03-01 MED ORDER — NITROGLYCERIN 0.4 MG SL SUBL: 0.4000 mg | SUBLINGUAL_TABLET | SUBLINGUAL | 3 refills | Status: AC | PRN

## 2024-03-01 NOTE — Patient Instructions (Signed)
 Medication Instructions:  Your physician has recommended you make the following change in your medication:   -Stop Aspirin  -Stop Zetia  -Stop Imdur   *If you need a refill on your cardiac medications before your next appointment, please call your pharmacy*  Lab Work: None If you have labs (blood work) drawn today and your tests are completely normal, you will receive your results only by: MyChart Message (if you have MyChart) OR A paper copy in the mail If you have any lab test that is abnormal or we need to change your treatment, we will call you to review the results.  Testing/Procedures: None  Follow-Up: At [facility], you and your health needs are our priority.  As part of our continuing mission to provide you with exceptional heart care, our providers are all part of one team.  This team includes your primary Cardiologist (physician) and Advanced Practice Providers or APPs (Physician Assistants and Nurse Practitioners) who all work together to provide you with the care you need, when you need it.  Your next appointment:   1 year(s)  Provider:   You may see Vishnu P Mallipeddi, MD or one of the following Advanced Practice Providers on your designated Care Team:   [place], PA-C  [place], [place] Theotis Flake, [place]     We recommend signing up for the patient portal called "MyChart".  Sign up information is provided on this After Visit Summary.  MyChart is used to connect with patients for Virtual Visits (Telemedicine).  Patients are able to view lab/test results, encounter notes, upcoming appointments, etc.  Non-urgent messages can be sent to your provider as well.   To learn more about what you can do with MyChart, go to [email or web address].   Other Instructions

## 2024-03-01 NOTE — Progress Notes (Signed)
 Cardiology Office Note  Date: 03/01/2024   ID: Benjamin Estrada, DOB 1971/09/25, MRN [id number]  PCP:  Omie Bickers, MD  Cardiologist:  Lasalle Pointer, MD Electrophysiologist:  None   Reason for Office Visit: CAD follow-up   History of Present Illness: Benjamin Estrada is a 53 y.o. male known to have CAD manifested by NSTEMI in 08/2021 s/p LHC showed 100% OM1, 20% proximal to mid LAD, 20% proximal RCA (on medical management and no PCI performed),, HLD, nicotine abuse (does not smoke cigarettes but chews tobacco) presents to the clinic for follow-up visit.  Currently taking medications every other day.  No interval ER visits or hospitalizations.  No angina or DOE.  No dizziness, lightheadedness, syncope, leg swelling.  Has been chewing tobacco since he was 53 years old.  Does not smoke cigarettes.  Past Medical History:  Diagnosis Date   High cholesterol     Past Surgical History:  Procedure Laterality Date   HERNIA REPAIR     LEFT HEART CATH AND CORONARY ANGIOGRAPHY N/A 09/16/2021   Procedure: LEFT HEART CATH AND CORONARY ANGIOGRAPHY;  Surgeon: [place], Peter M, MD;  Location: [facility];  Service: Cardiovascular;  Laterality: N/A;   SKIN SURGERY     MRSA taken off the left arm    Current Outpatient Medications  Medication Sig Dispense Refill   aspirin  EC 81 MG tablet Take 1 tablet (81 mg total) by mouth daily. Swallow whole. 90 tablet 2   atorvastatin  (LIPITOR) 80 MG tablet TAKE 1 TABLET BY MOUTH EVERY DAY AT 6PM 90 tablet 3   clopidogrel  (PLAVIX ) 75 MG tablet Take 1 tablet (75 mg total) by mouth daily. 90 tablet 3   isosorbide  mononitrate (IMDUR ) 30 MG 24 hr tablet Take 0.5 tablets (15 mg total) by mouth daily. Please schedule an appointment for further refills. 45 tablet 3   metoprolol  succinate (TOPROL  XL) 25 MG 24 hr tablet Take 0.5 tablets (12.5 mg total) by mouth daily. 45 tablet 3   nitroGLYCERIN  (NITROSTAT ) 0.4 MG SL tablet Place 1 tablet (0.4 mg total)  under the tongue every 5 (five) minutes x 3 doses as needed for chest pain. 30 tablet 3   Omeprazole Magnesium (PRILOSEC PO)      ezetimibe  (ZETIA ) 10 MG tablet Take 1 tablet (10 mg total) by mouth daily. 90 tablet 3   No current facility-administered medications for this visit.   Allergies:  Patient has no known allergies.   Social History: The patient  reports that he has never smoked. His smokeless tobacco use includes chew. He reports that he does not drink alcohol and does not use drugs.   Family History: The patient's family history includes CVA in his mother; Cancer in his father; Heart attack in his father.   ROS:  Please see the history of present illness. Otherwise, complete review of systems is positive for none.  All other systems are reviewed and negative.   Physical Exam: VS:  BP 130/88 (BP Location: Right Arm, Patient Position: Sitting, Cuff Size: Normal)   Pulse 68   Ht 6' (1.829 m)   Wt 220 lb 6.4 oz (100 kg)   SpO2 94%   BMI 29.89 kg/m , BMI Body mass index is 29.89 kg/m.  Wt Readings from Last 3 Encounters:  03/01/24 220 lb 6.4 oz (100 kg)  11/20/22 220 lb (99.8 kg)  10/01/21 209 lb (94.8 kg)    General: Patient appears comfortable at rest. HEENT: Conjunctiva and lids  normal, oropharynx clear with moist mucosa. Neck: Supple, no elevated JVP or carotid bruits, no thyromegaly. Lungs: Clear to auscultation, nonlabored breathing at rest. Cardiac: Regular rate and rhythm, no S3 or significant systolic murmur, no pericardial rub. Abdomen: Soft, nontender, no hepatomegaly, bowel sounds present, no guarding or rebound. Extremities: No pitting edema, distal pulses 2+. Skin: Warm and dry. Musculoskeletal: No kyphosis. Neuropsychiatric: Alert and oriented x3, affect grossly appropriate.  ECG:  An ECG dated 11/20/2022 was personally reviewed today and demonstrated:  NSR and no ST-T changes  Recent Labwork: No results found for requested labs within last 365 days.      Component Value Date/Time   CHOL 104 11/12/2021 0813   TRIG 90 11/12/2021 0813   HDL 33 (L) 11/12/2021 0813   CHOLHDL 3.2 11/12/2021 0813   VLDL 18 11/12/2021 0813   LDLCALC 53 11/12/2021 0813     Assessment and Plan:  # CAD manifested by NSTEMI in 08/2021 s/p LHC showed 100% OM1, 20% proximal to mid LAD, 20% proximal RCA (on medical management and no PCI performed), currently angina free - Currently taking all medications every other day.  Stop aspirin  continue Plavix  and 5 mg once daily.  Continue atorvastatin  8080 mg nightly and stop Zetia .  LDL normal. - No interval discontinue Imdur  and continue metoprolol  succinate 12.5 mg once daily. - Refill sublingual nitroglycerin .  # HLD, at goal - Currently taking atorvastatin  and Zetia .  LDL less than 70 with recent labs from March 2025.  Discontinue Zetia  and continue atorvastatin  80 mg nightly strongly encouraged to take medications daily.  # Tobacco abuse - Has been chewing tobacco since he was 53 years old.  Does not smoke cigarettes.  Counseling provided.    Medication Adjustments/Labs and Tests Ordered: Current medicines are reviewed at length with the patient today.  Concerns regarding medicines are outlined above.   Tests Ordered: Orders Placed This Encounter  Procedures   EKG 12-Lead    Medication Changes: No orders of the defined types were placed in this encounter.   Disposition:  Follow up 1 year  Signed, Anterio Scheel Beauford Bounds, MD, 03/01/2024 9:22 AM    [facility] [address] [place], [place] [address]

## 2024-09-22 ENCOUNTER — Ambulatory Visit: Admitting: Internal Medicine
